# Patient Record
Sex: Female | Born: 1960 | Race: White | Hispanic: No | Smoking: Former smoker
Health system: Southern US, Community
[De-identification: ages and names within clinical notes are randomized; demographics above are authoritative.]

## PROBLEM LIST (undated history)

## (undated) ENCOUNTER — Emergency Department: Discharge status: Absent without leave | Payer: BLUE CROSS/BLUE SHIELD

## (undated) DIAGNOSIS — I10 Essential (primary) hypertension: Secondary | ICD-10-CM

## (undated) DIAGNOSIS — F329 Major depressive disorder, single episode, unspecified: Secondary | ICD-10-CM

## (undated) DIAGNOSIS — F32A Depression, unspecified: Secondary | ICD-10-CM

## (undated) HISTORY — PX: GANGLION CYST EXCISION: SHX1691

## (undated) HISTORY — PX: FRACTURE SURGERY: SHX138

## (undated) HISTORY — PX: ABDOMINAL HYSTERECTOMY: SHX81

---

## 2009-07-23 ENCOUNTER — Ambulatory Visit: Payer: Self-pay | Admitting: Orthopedic Surgery

## 2009-07-30 ENCOUNTER — Ambulatory Visit: Payer: Self-pay | Admitting: Orthopedic Surgery

## 2009-08-19 ENCOUNTER — Ambulatory Visit: Payer: Self-pay | Admitting: Family Medicine

## 2009-08-28 ENCOUNTER — Ambulatory Visit: Payer: Self-pay | Admitting: Family Medicine

## 2012-04-11 ENCOUNTER — Ambulatory Visit: Payer: Self-pay | Admitting: Family Medicine

## 2012-06-09 ENCOUNTER — Ambulatory Visit: Payer: Self-pay | Admitting: Unknown Physician Specialty

## 2012-06-12 LAB — PATHOLOGY REPORT

## 2013-11-01 ENCOUNTER — Other Ambulatory Visit: Payer: Self-pay | Admitting: Orthopedic Surgery

## 2013-11-01 LAB — SYNOVIAL CELL COUNT + DIFF, W/ CRYSTALS
Basophil: 0 %
Eosinophil: 0 %
LYMPHS PCT: 30 %
Neutrophils: 46 %
Nucleated Cell Count: 194 /mm3
Other Cells BF: 0 %
Other Mononuclear Cells: 24 %

## 2013-11-05 LAB — BODY FLUID CULTURE

## 2015-12-17 ENCOUNTER — Other Ambulatory Visit: Payer: Self-pay | Admitting: Orthopedic Surgery

## 2016-01-01 ENCOUNTER — Other Ambulatory Visit: Payer: Self-pay | Admitting: Orthopedic Surgery

## 2016-01-14 ENCOUNTER — Encounter
Admission: RE | Admit: 2016-01-14 | Discharge: 2016-01-14 | Disposition: A | Discharge status: Home / Self Care | Payer: BLUE CROSS/BLUE SHIELD | Source: Ambulatory Visit | Attending: Orthopedic Surgery | Admitting: Orthopedic Surgery

## 2016-01-14 ENCOUNTER — Ambulatory Visit
Admission: RE | Admit: 2016-01-14 | Discharge: 2016-01-14 | Disposition: A | Discharge status: Home / Self Care | Payer: BLUE CROSS/BLUE SHIELD | Source: Ambulatory Visit | Attending: Orthopedic Surgery | Admitting: Orthopedic Surgery

## 2016-01-14 DIAGNOSIS — Z01811 Encounter for preprocedural respiratory examination: Secondary | ICD-10-CM | POA: Insufficient documentation

## 2016-01-14 DIAGNOSIS — Z01818 Encounter for other preprocedural examination: Secondary | ICD-10-CM | POA: Insufficient documentation

## 2016-01-14 HISTORY — DX: Depression, unspecified: F32.A

## 2016-01-14 HISTORY — DX: Major depressive disorder, single episode, unspecified: F32.9

## 2016-01-14 HISTORY — DX: Essential (primary) hypertension: I10

## 2016-01-14 LAB — CBC WITH DIFFERENTIAL/PLATELET
BASOS ABS: 0.1 10*3/uL (ref 0–0.1)
BASOS PCT: 1 %
EOS ABS: 0.3 10*3/uL (ref 0–0.7)
EOS PCT: 3 %
HCT: 39.6 % (ref 35.0–47.0)
Hemoglobin: 13.7 g/dL (ref 12.0–16.0)
Lymphocytes Relative: 26 %
Lymphs Abs: 2.3 10*3/uL (ref 1.0–3.6)
MCH: 30.6 pg (ref 26.0–34.0)
MCHC: 34.6 g/dL (ref 32.0–36.0)
MCV: 88.5 fL (ref 80.0–100.0)
MONO ABS: 0.8 10*3/uL (ref 0.2–0.9)
Monocytes Relative: 9 %
Neutro Abs: 5.4 10*3/uL (ref 1.4–6.5)
Neutrophils Relative %: 61 %
PLATELETS: 216 10*3/uL (ref 150–440)
RBC: 4.47 MIL/uL (ref 3.80–5.20)
RDW: 13.6 % (ref 11.5–14.5)
WBC: 8.9 10*3/uL (ref 3.6–11.0)

## 2016-01-14 LAB — TYPE AND SCREEN
ABO/RH(D): O POS
Antibody Screen: NEGATIVE

## 2016-01-14 LAB — URINALYSIS COMPLETE WITH MICROSCOPIC (ARMC ONLY)
Bacteria, UA: NONE SEEN
Bilirubin Urine: NEGATIVE
Glucose, UA: NEGATIVE mg/dL
KETONES UR: NEGATIVE mg/dL
LEUKOCYTES UA: NEGATIVE
Nitrite: NEGATIVE
PH: 5 (ref 5.0–8.0)
PROTEIN: NEGATIVE mg/dL
Specific Gravity, Urine: 1.01 (ref 1.005–1.030)
WBC UA: NONE SEEN WBC/hpf (ref 0–5)

## 2016-01-14 LAB — HEMOGLOBIN A1C: HEMOGLOBIN A1C: 6.3 % — AB (ref 4.0–6.0)

## 2016-01-14 LAB — BASIC METABOLIC PANEL
Anion gap: 5 (ref 5–15)
BUN: 18 mg/dL (ref 6–20)
CALCIUM: 9.2 mg/dL (ref 8.9–10.3)
CO2: 30 mmol/L (ref 22–32)
Chloride: 102 mmol/L (ref 101–111)
Creatinine, Ser: 0.86 mg/dL (ref 0.44–1.00)
GFR calc Af Amer: >60 mL/min (ref >60)
GLUCOSE: 121 mg/dL — AB (ref 65–99)
Potassium: 3.9 mmol/L (ref 3.5–5.1)
SODIUM: 137 mmol/L (ref 135–145)

## 2016-01-14 LAB — SURGICAL PCR SCREEN
MRSA, PCR: NEGATIVE
Staphylococcus aureus: NEGATIVE

## 2016-01-14 LAB — PROTIME-INR
INR: 0.81
PROTHROMBIN TIME: 11.2 s — AB (ref 11.4–15.2)

## 2016-01-14 LAB — APTT: APTT: 27 s (ref 24–36)

## 2016-01-14 NOTE — Patient Instructions (Signed)
Your procedure is scheduled on: Thursday 01/22/16 Report to Day Surgery. 2ND FLOOR MEDICAL MALL ENTRANCE To find out your arrival time please call [phone] between 1PM - 3PM on Wednesday 01/21/16.  Remember: Instructions that are not followed completely may result in serious medical risk, up to and including death, or upon the discretion of your surgeon and anesthesiologist your surgery may need to be rescheduled.    __X__ 1. Do not eat food or drink liquids after midnight. No gum chewing or hard candies.     __X__ 2. No Alcohol for 24 hours before or after surgery.   ____ 3. Bring all medications with you on the day of surgery if instructed.    __X__ 4. Notify your doctor if there is any change in your medical condition     (cold, fever, infections).     Do not wear jewelry, make-up, hairpins, clips or nail polish.  Do not wear lotions, powders, or perfumes.   Do not shave 48 hours prior to surgery. Men may shave face and neck.  Do not bring valuables to the hospital.    [facility] is not responsible for any belongings or valuables.               Contacts, dentures or bridgework may not be worn into surgery.  Leave your suitcase in the car. After surgery it may be brought to your room.  For patients admitted to the hospital, discharge time is determined by your                treatment team.   Patients discharged the day of surgery will not be allowed to drive home.   Please read over the following fact sheets that you were given:   MRSA Information and Surgical Site Infection Prevention   __X__ Take these medicines the morning of surgery with A SIP OF WATER:    1. AMLODIPINE  2. ARIPIPRAZOLE  3. BUPROPION  4. FEXOFENADINE  5. TELMISARTAN  6.  ____ Fleet Enema (as directed)   __X__ Use CHG Soap as directed  ____ Use inhalers on the day of surgery  ____ Stop metformin 2 days prior to surgery    ____ Take 1/2 of usual insulin dose the night before surgery and none on  the morning of surgery.   __X__ Stop Coumadin/Plavix/aspirin on 7 DAYS PRIOR TO SURGERY  ____ Stop Anti-inflammatories on    __X__ Stop supplements until after surgery.   FISH OIL, CINNAMON TODAY  ____ Bring C-Pap to the hospital.

## 2016-01-16 NOTE — Pre-Procedure Instructions (Addendum)
11/18/2015 [facility] Component Name Value Ref Range  Vent Rate (bpm) 69   PR Interval (msec) 152   QRS Interval (msec) 88   QT Interval (msec) 424   QTc (msec) 454   Result Narrative  Normal sinus rhythm Left ventricular hypertrophy with repolarization abnormality Baseline artifact  Abnormal ECG No previous ECGs available I reviewed and concur with this report. Electronically signed ZO:[id number]:FREEDMAN, MD, NEIL ([id number]) on 11/18/2015 10:40:41 PM  Status Results Details      EKG 01/14/16 OK BY DR Shela CommonsJ ADAMS

## 2016-01-22 ENCOUNTER — Inpatient Hospital Stay: Payer: BLUE CROSS/BLUE SHIELD | Admitting: Anesthesiology

## 2016-01-22 ENCOUNTER — Inpatient Hospital Stay: Payer: BLUE CROSS/BLUE SHIELD

## 2016-01-22 ENCOUNTER — Encounter: Payer: Self-pay | Admitting: *Deleted

## 2016-01-22 ENCOUNTER — Encounter
Admission: RE | Disposition: A | Discharge status: Home / Self Care | Payer: Self-pay | Source: Ambulatory Visit | Attending: Orthopedic Surgery

## 2016-01-22 ENCOUNTER — Inpatient Hospital Stay
Admission: RE | Admit: 2016-01-22 | Discharge: 2016-01-24 | DRG: 470 | Disposition: A | Discharge status: Home / Self Care | Payer: BLUE CROSS/BLUE SHIELD | Source: Ambulatory Visit | Attending: Orthopedic Surgery | Admitting: Orthopedic Surgery

## 2016-01-22 DIAGNOSIS — Z79899 Other long term (current) drug therapy: Secondary | ICD-10-CM | POA: Diagnosis not present

## 2016-01-22 DIAGNOSIS — I10 Essential (primary) hypertension: Secondary | ICD-10-CM | POA: Diagnosis present

## 2016-01-22 DIAGNOSIS — Z96659 Presence of unspecified artificial knee joint: Secondary | ICD-10-CM

## 2016-01-22 DIAGNOSIS — M1712 Unilateral primary osteoarthritis, left knee: Principal | ICD-10-CM | POA: Diagnosis present

## 2016-01-22 DIAGNOSIS — F329 Major depressive disorder, single episode, unspecified: Secondary | ICD-10-CM | POA: Diagnosis present

## 2016-01-22 DIAGNOSIS — Z87891 Personal history of nicotine dependence: Secondary | ICD-10-CM

## 2016-01-22 HISTORY — PX: TOTAL KNEE ARTHROPLASTY: SHX125

## 2016-01-22 SURGERY — ARTHROPLASTY, KNEE, TOTAL
Anesthesia: Monitor Anesthesia Care | Site: Knee | Laterality: Left | Wound class: Clean

## 2016-01-22 MED ORDER — MEPERIDINE HCL 25 MG/ML IJ SOLN: 6.2500 mg | INTRAMUSCULAR | Status: DC | PRN

## 2016-01-22 MED ORDER — CINNAMON 500 MG PO CAPS: 1000.0000 mg | ORAL_CAPSULE | Freq: Every day | ORAL | Status: DC

## 2016-01-22 MED ORDER — CLINDAMYCIN PHOSPHATE 600 MG/50ML IV SOLN
600.0000 mg | Freq: Once | INTRAVENOUS | Status: AC
  Administered 2016-01-22: 600 mg via INTRAVENOUS

## 2016-01-22 MED ORDER — METHOCARBAMOL 1000 MG/10ML IJ SOLN
500.0000 mg | Freq: Four times a day (QID) | INTRAVENOUS | Status: DC | PRN
  Filled 2016-01-22: qty 5

## 2016-01-22 MED ORDER — FENTANYL CITRATE (PF) 100 MCG/2ML IJ SOLN
25.0000 ug | INTRAMUSCULAR | Status: DC | PRN
  Administered 2016-01-22 (×2): 50 ug via INTRAVENOUS

## 2016-01-22 MED ORDER — CELECOXIB 200 MG PO CAPS
200.0000 mg | ORAL_CAPSULE | Freq: Two times a day (BID) | ORAL | Status: DC
  Administered 2016-01-22 – 2016-01-24 (×5): 200 mg via ORAL
  Filled 2016-01-22 (×5): qty 1

## 2016-01-22 MED ORDER — DOCUSATE SODIUM 100 MG PO CAPS
100.0000 mg | ORAL_CAPSULE | Freq: Two times a day (BID) | ORAL | Status: DC
  Administered 2016-01-22 – 2016-01-24 (×5): 100 mg via ORAL
  Filled 2016-01-22 (×5): qty 1

## 2016-01-22 MED ORDER — MENTHOL 3 MG MT LOZG
1.0000 | LOZENGE | OROMUCOSAL | Status: DC | PRN
  Filled 2016-01-22: qty 9

## 2016-01-22 MED ORDER — GABAPENTIN 300 MG PO CAPS
300.0000 mg | ORAL_CAPSULE | Freq: Three times a day (TID) | ORAL | Status: DC
  Administered 2016-01-22 – 2016-01-24 (×6): 300 mg via ORAL
  Filled 2016-01-22 (×6): qty 1

## 2016-01-22 MED ORDER — METHOCARBAMOL 500 MG PO TABS: 500.0000 mg | ORAL_TABLET | Freq: Four times a day (QID) | ORAL | Status: DC | PRN

## 2016-01-22 MED ORDER — ENOXAPARIN SODIUM 30 MG/0.3ML ~~LOC~~ SOLN
30.0000 mg | Freq: Two times a day (BID) | SUBCUTANEOUS | Status: DC
  Administered 2016-01-23 – 2016-01-24 (×3): 30 mg via SUBCUTANEOUS
  Filled 2016-01-22 (×3): qty 0.3

## 2016-01-22 MED ORDER — PROPOFOL 500 MG/50ML IV EMUL
INTRAVENOUS | Status: DC | PRN
  Administered 2016-01-22: 100 ug/kg/min via INTRAVENOUS

## 2016-01-22 MED ORDER — ACETAMINOPHEN 325 MG PO TABS: 650.0000 mg | ORAL_TABLET | Freq: Four times a day (QID) | ORAL | Status: DC | PRN

## 2016-01-22 MED ORDER — DIPHENHYDRAMINE HCL 12.5 MG/5ML PO ELIX: 12.5000 mg | ORAL_SOLUTION | ORAL | Status: DC | PRN

## 2016-01-22 MED ORDER — ONDANSETRON HCL 4 MG/2ML IJ SOLN: 4.0000 mg | Freq: Four times a day (QID) | INTRAMUSCULAR | Status: DC | PRN

## 2016-01-22 MED ORDER — PROPOFOL 10 MG/ML IV BOLUS
INTRAVENOUS | Status: DC | PRN
  Administered 2016-01-22: 50 mg via INTRAVENOUS

## 2016-01-22 MED ORDER — HYDROCHLOROTHIAZIDE 25 MG PO TABS
25.0000 mg | ORAL_TABLET | Freq: Every day | ORAL | Status: DC
  Administered 2016-01-23 – 2016-01-24 (×2): 25 mg via ORAL
  Filled 2016-01-22: qty 1

## 2016-01-22 MED ORDER — ASPIRIN EC 81 MG PO TBEC
81.0000 mg | DELAYED_RELEASE_TABLET | Freq: Every day | ORAL | Status: DC
  Administered 2016-01-22 – 2016-01-23 (×2): 81 mg via ORAL
  Filled 2016-01-22 (×2): qty 1

## 2016-01-22 MED ORDER — OXYCODONE HCL 5 MG PO TABS
10.0000 mg | ORAL_TABLET | ORAL | Status: DC | PRN
  Administered 2016-01-22 – 2016-01-24 (×5): 10 mg via ORAL
  Filled 2016-01-22 (×5): qty 2

## 2016-01-22 MED ORDER — AMLODIPINE BESYLATE 10 MG PO TABS
10.0000 mg | ORAL_TABLET | Freq: Every day | ORAL | Status: DC
  Filled 2016-01-22: qty 1

## 2016-01-22 MED ORDER — ONDANSETRON HCL 4 MG PO TABS: 4.0000 mg | ORAL_TABLET | Freq: Four times a day (QID) | ORAL | Status: DC | PRN

## 2016-01-22 MED ORDER — LACTATED RINGERS IV SOLN
INTRAVENOUS | Status: DC
  Administered 2016-01-22 (×2): via INTRAVENOUS

## 2016-01-22 MED ORDER — NEOMYCIN-POLYMYXIN B GU 40-200000 IR SOLN
Status: AC
  Filled 2016-01-22: qty 20

## 2016-01-22 MED ORDER — IRBESARTAN 150 MG PO TABS
300.0000 mg | ORAL_TABLET | Freq: Every day | ORAL | Status: DC
  Administered 2016-01-23 – 2016-01-24 (×2): 300 mg via ORAL
  Filled 2016-01-22: qty 2

## 2016-01-22 MED ORDER — METOCLOPRAMIDE HCL 5 MG/ML IJ SOLN: 5.0000 mg | Freq: Three times a day (TID) | INTRAMUSCULAR | Status: DC | PRN

## 2016-01-22 MED ORDER — OXYCODONE HCL 5 MG/5ML PO SOLN: 5.0000 mg | Freq: Once | ORAL | Status: DC | PRN

## 2016-01-22 MED ORDER — BUPIVACAINE HCL (PF) 0.5 % IJ SOLN
INTRAMUSCULAR | Status: DC | PRN
  Administered 2016-01-22: 3 mL

## 2016-01-22 MED ORDER — SODIUM CHLORIDE 0.9 % IV SOLN
INTRAVENOUS | Status: DC
  Administered 2016-01-22: 12:00:00 via INTRAVENOUS
  Administered 2016-01-22: 1000 mL via INTRAVENOUS
  Administered 2016-01-23: 04:00:00 via INTRAVENOUS

## 2016-01-22 MED ORDER — BUPROPION HCL ER (XL) 150 MG PO TB24
300.0000 mg | ORAL_TABLET | Freq: Every day | ORAL | Status: DC
  Administered 2016-01-23 – 2016-01-24 (×2): 300 mg via ORAL
  Filled 2016-01-22 (×2): qty 2

## 2016-01-22 MED ORDER — MORPHINE SULFATE (PF) 4 MG/ML IV SOLN
INTRAVENOUS | Status: AC
  Filled 2016-01-22: qty 1

## 2016-01-22 MED ORDER — FAMOTIDINE 20 MG PO TABS
20.0000 mg | ORAL_TABLET | Freq: Once | ORAL | Status: AC
  Administered 2016-01-22: 20 mg via ORAL

## 2016-01-22 MED ORDER — FENTANYL CITRATE (PF) 100 MCG/2ML IJ SOLN
INTRAMUSCULAR | Status: AC
  Filled 2016-01-22: qty 2

## 2016-01-22 MED ORDER — SODIUM CHLORIDE 0.9 % IJ SOLN
INTRAMUSCULAR | Status: AC
  Filled 2016-01-22: qty 50

## 2016-01-22 MED ORDER — SODIUM CHLORIDE FLUSH 0.9 % IV SOLN
INTRAVENOUS | Status: AC
  Administered 2016-01-22: 10 mL
  Filled 2016-01-22: qty 3

## 2016-01-22 MED ORDER — METOCLOPRAMIDE HCL 10 MG PO TABS: 5.0000 mg | ORAL_TABLET | Freq: Three times a day (TID) | ORAL | Status: DC | PRN

## 2016-01-22 MED ORDER — CLINDAMYCIN PHOSPHATE 600 MG/50ML IV SOLN
600.0000 mg | Freq: Four times a day (QID) | INTRAVENOUS | Status: AC
  Administered 2016-01-22 (×2): 600 mg via INTRAVENOUS
  Filled 2016-01-22 (×2): qty 50

## 2016-01-22 MED ORDER — MAGNESIUM HYDROXIDE 400 MG/5ML PO SUSP
30.0000 mL | Freq: Every day | ORAL | Status: DC | PRN
  Administered 2016-01-23: 30 mL via ORAL
  Filled 2016-01-22: qty 30

## 2016-01-22 MED ORDER — CHLORHEXIDINE GLUCONATE 4 % EX LIQD: 60.0000 mL | Freq: Once | CUTANEOUS | Status: DC

## 2016-01-22 MED ORDER — ALUM & MAG HYDROXIDE-SIMETH 200-200-20 MG/5ML PO SUSP: 30.0000 mL | ORAL | Status: DC | PRN

## 2016-01-22 MED ORDER — ZOLPIDEM TARTRATE 5 MG PO TABS: 5.0000 mg | ORAL_TABLET | Freq: Every evening | ORAL | Status: DC | PRN

## 2016-01-22 MED ORDER — PHENOL 1.4 % MT LIQD
1.0000 | OROMUCOSAL | Status: DC | PRN
  Filled 2016-01-22: qty 177

## 2016-01-22 MED ORDER — BISACODYL 5 MG PO TBEC
5.0000 mg | DELAYED_RELEASE_TABLET | Freq: Every day | ORAL | Status: DC | PRN
  Administered 2016-01-23: 5 mg via ORAL
  Filled 2016-01-22: qty 1

## 2016-01-22 MED ORDER — CLINDAMYCIN PHOSPHATE 600 MG/50ML IV SOLN
INTRAVENOUS | Status: AC
  Filled 2016-01-22: qty 50

## 2016-01-22 MED ORDER — ARIPIPRAZOLE 2 MG PO TABS
2.0000 mg | ORAL_TABLET | Freq: Every day | ORAL | Status: DC
  Administered 2016-01-22 – 2016-01-23 (×2): 2 mg via ORAL
  Filled 2016-01-22 (×2): qty 1

## 2016-01-22 MED ORDER — HYDROMORPHONE HCL 1 MG/ML IJ SOLN: 1.0000 mg | INTRAMUSCULAR | Status: DC | PRN

## 2016-01-22 MED ORDER — FAMOTIDINE 20 MG PO TABS
ORAL_TABLET | ORAL | Status: AC
  Filled 2016-01-22: qty 1

## 2016-01-22 MED ORDER — BUPIVACAINE-EPINEPHRINE (PF) 0.25% -1:200000 IJ SOLN
INTRAMUSCULAR | Status: AC
  Filled 2016-01-22: qty 30

## 2016-01-22 MED ORDER — NEOMYCIN-POLYMYXIN B GU 40-200000 IR SOLN
Status: DC | PRN
  Administered 2016-01-22: 16 mL

## 2016-01-22 MED ORDER — MAGNESIUM CITRATE PO SOLN
1.0000 | Freq: Once | ORAL | Status: DC | PRN
  Filled 2016-01-22: qty 296

## 2016-01-22 MED ORDER — BUPIVACAINE LIPOSOME 1.3 % IJ SUSP
INTRAMUSCULAR | Status: AC
  Filled 2016-01-22: qty 20

## 2016-01-22 MED ORDER — OXYCODONE HCL 5 MG PO TABS: 5.0000 mg | ORAL_TABLET | Freq: Once | ORAL | Status: DC | PRN

## 2016-01-22 MED ORDER — ACETAMINOPHEN 650 MG RE SUPP: 650.0000 mg | Freq: Four times a day (QID) | RECTAL | Status: DC | PRN

## 2016-01-22 MED ORDER — EPHEDRINE SULFATE 50 MG/ML IJ SOLN
INTRAMUSCULAR | Status: DC | PRN
  Administered 2016-01-22: 15 mg via INTRAVENOUS
  Administered 2016-01-22: 10 mg via INTRAVENOUS

## 2016-01-22 MED ORDER — PROMETHAZINE HCL 25 MG/ML IJ SOLN: 6.2500 mg | INTRAMUSCULAR | Status: DC | PRN

## 2016-01-22 MED ORDER — LORATADINE 10 MG PO TABS
10.0000 mg | ORAL_TABLET | Freq: Every day | ORAL | Status: DC
  Administered 2016-01-23 – 2016-01-24 (×2): 10 mg via ORAL
  Filled 2016-01-22: qty 1

## 2016-01-22 MED ORDER — PHENYLEPHRINE HCL 10 MG/ML IJ SOLN
INTRAMUSCULAR | Status: DC | PRN
  Administered 2016-01-22: 100 ug via INTRAVENOUS

## 2016-01-22 MED ORDER — MORPHINE SULFATE 4 MG/ML IJ SOLN
INTRAMUSCULAR | Status: DC | PRN
  Administered 2016-01-22: 32 mL via INTRA_ARTICULAR

## 2016-01-22 MED ORDER — MIDAZOLAM HCL 5 MG/5ML IJ SOLN
INTRAMUSCULAR | Status: DC | PRN
  Administered 2016-01-22 (×2): 1 mg via INTRAVENOUS

## 2016-01-22 MED ORDER — ATORVASTATIN CALCIUM 20 MG PO TABS
20.0000 mg | ORAL_TABLET | Freq: Every day | ORAL | Status: DC
  Administered 2016-01-22 – 2016-01-23 (×2): 20 mg via ORAL
  Filled 2016-01-22 (×2): qty 1

## 2016-01-22 MED ORDER — SODIUM CHLORIDE 0.9 % IV SOLN
INTRAVENOUS | Status: DC | PRN
  Administered 2016-01-22: 120 mL

## 2016-01-22 MED ORDER — SODIUM CHLORIDE 0.9 % IV SOLN
INTRAVENOUS | Status: DC | PRN
  Administered 2016-01-22: 10 ug/min via INTRAVENOUS

## 2016-01-22 SURGICAL SUPPLY — 70 items
AUTOTRANSFUS HAS 1/8 (MISCELLANEOUS) ×3
BAG DECANTER FOR FLEXI CONT (MISCELLANEOUS) IMPLANT
BLADE DEBAKEY 8.0 (BLADE) ×2 IMPLANT
BLADE DEBAKEY 8.0MM (BLADE) ×1
BLADE SAW 1 (BLADE) ×3 IMPLANT
BLADE SAW 1/2 (BLADE) ×3 IMPLANT
BLADE SURG 15 STRL LF DISP TIS (BLADE) ×1 IMPLANT
BLADE SURG 15 STRL SS (BLADE) ×2
BOWL CEMENT MIX W/ADAPTER (MISCELLANEOUS) ×3 IMPLANT
CANISTER SUCT 1200ML W/VALVE (MISCELLANEOUS) ×6 IMPLANT
CAP KNEE TOTAL 3 SIGMA ×3 IMPLANT
CATH TRAY METER 16FR LF (MISCELLANEOUS) ×3 IMPLANT
CEMENT HV SMART SET (Cement) ×9 IMPLANT
CNTNR SPEC 2.5X3XGRAD LEK (MISCELLANEOUS) ×1
CONT SPEC 4OZ STER OR WHT (MISCELLANEOUS) ×2
CONTAINER SPEC 2.5X3XGRAD LEK (MISCELLANEOUS) ×1 IMPLANT
COOLER POLAR GLACIER W/PUMP (MISCELLANEOUS) ×3 IMPLANT
CUFF TOURN 24 STER (MISCELLANEOUS) IMPLANT
CUFF TOURN 30 STER DUAL PORT (MISCELLANEOUS) ×3 IMPLANT
DRAPE IMP U-DRAPE 54X76 (DRAPES) ×3 IMPLANT
DRAPE INCISE IOBAN 66X60 STRL (DRAPES) ×3 IMPLANT
DRAPE SHEET LG 3/4 BI-LAMINATE (DRAPES) ×3 IMPLANT
DRAPE SURG 17X11 SM STRL (DRAPES) ×6 IMPLANT
DRSG OPSITE POSTOP 4X12 (GAUZE/BANDAGES/DRESSINGS) ×3 IMPLANT
DRSG OPSITE POSTOP 4X14 (GAUZE/BANDAGES/DRESSINGS) IMPLANT
DURAPREP 26ML APPLICATOR (WOUND CARE) ×9 IMPLANT
ELECT REM PT RETURN 9FT ADLT (ELECTROSURGICAL) ×3
ELECTRODE REM PT RTRN 9FT ADLT (ELECTROSURGICAL) ×1 IMPLANT
GAUZE PETRO XEROFOAM 1X8 (MISCELLANEOUS) ×3 IMPLANT
GAUZE SPONGE 4X4 12PLY STRL (GAUZE/BANDAGES/DRESSINGS) IMPLANT
GLOVE BIOGEL PI IND STRL 9 (GLOVE) ×2 IMPLANT
GLOVE BIOGEL PI INDICATOR 9 (GLOVE) ×4
GLOVE SURG 9.0 ORTHO LTXF (GLOVE) ×9 IMPLANT
GOWN STRL REUS TWL 2XL XL LVL4 (GOWN DISPOSABLE) ×3 IMPLANT
GOWN STRL REUS W/ TWL LRG LVL3 (GOWN DISPOSABLE) ×2 IMPLANT
GOWN STRL REUS W/TWL LRG LVL3 (GOWN DISPOSABLE) ×4
GOWN STRL REUS W/TWL XL LVL4 (GOWN DISPOSABLE) ×3 IMPLANT
HANDPIECE INTERPULSE COAX TIP (DISPOSABLE) ×2
IMMBOLIZER KNEE 19 BLUE UNIV (SOFTGOODS) ×3 IMPLANT
IV NS 100ML SINGLE PACK (IV SOLUTION) IMPLANT
KIT RM TURNOVER STRD PROC AR (KITS) ×3 IMPLANT
NDL SAFETY 18GX1.5 (NEEDLE) ×3 IMPLANT
NDL SAFETY 22GX1.5 (NEEDLE) ×3 IMPLANT
NEEDLE FILTER BLUNT 18X 1/2SAF (NEEDLE) ×2
NEEDLE FILTER BLUNT 18X1 1/2 (NEEDLE) ×1 IMPLANT
NEEDLE SPNL 20GX3.5 QUINCKE YW (NEEDLE) ×3 IMPLANT
NS IRRIG 1000ML POUR BTL (IV SOLUTION) ×3 IMPLANT
PACK TOTAL KNEE (MISCELLANEOUS) ×3 IMPLANT
PAD PREP 24X41 OB/GYN DISP (PERSONAL CARE ITEMS) IMPLANT
PAD WRAPON POLAR KNEE (MISCELLANEOUS) ×1 IMPLANT
SET HNDPC FAN SPRY TIP SCT (DISPOSABLE) ×1 IMPLANT
SOL .9 NS 3000ML IRR  AL (IV SOLUTION) ×2
SOL .9 NS 3000ML IRR UROMATIC (IV SOLUTION) ×1 IMPLANT
SPONGE DRAIN TRACH 4X4 STRL 2S (GAUZE/BANDAGES/DRESSINGS) ×3 IMPLANT
SPONGE LAP 18X18 5 PK (GAUZE/BANDAGES/DRESSINGS) IMPLANT
STAPLER SKIN PROX 35W (STAPLE) ×3 IMPLANT
SUCTION FRAZIER HANDLE 10FR (MISCELLANEOUS) ×2
SUCTION TUBE FRAZIER 10FR DISP (MISCELLANEOUS) ×1 IMPLANT
SUT ETHIBOND NAB CT1 #1 30IN (SUTURE) ×6 IMPLANT
SUT MNCRL AB 3-0 PS2 27 (SUTURE) IMPLANT
SUT VIC AB 0 CT1 36 (SUTURE) ×3 IMPLANT
SUT VIC AB 2-0 CT1 (SUTURE) ×6 IMPLANT
SYR 20CC LL (SYRINGE) ×3 IMPLANT
SYR 50ML LL SCALE MARK (SYRINGE) ×6 IMPLANT
SYSTEM AUTOTRANSFUS DUAL TROCR (MISCELLANEOUS) ×1 IMPLANT
TOWER CARTRIDGE SMART MIX (DISPOSABLE) ×3 IMPLANT
TUBE SUCT KAM VAC (TUBING) ×3 IMPLANT
WIRE Z .062 C-WIRE SPADE TIP (WIRE) ×3 IMPLANT
WRAPON POLAR PAD KNEE (MISCELLANEOUS) ×3
c-wire .062 spade (1.57mm) IMPLANT

## 2016-01-22 NOTE — Evaluation (Signed)
Physical Therapy Evaluation Patient Details Name: Denise MechanicDeborah C Mcintyre MRN: [id number] DOB: 17-Sep-1960 Today's Date: 01/22/2016   History of Present Illness  Denise DoveDebbie Mcintyre is a 55yo white female who comes [facility] for elective L TKA after a chronic history of DJD and decline in function.   Clinical Impression  Pt presenting on POD0 for PT evaluation. Pt reports return of sensation to operative limb. Nasal canual doffed upon entry with SaO2 WNL. Pt completes all therex with minA and heavy VC for instruction. All functional mobility is performed with supervision-minA, which is decreased from her baseline level of function. Minimal ambulation is performed at bedside and tolerated well, then patient is helped to transition to chair. All mobility performed with Left KI donned. The patient is at high risk for falls as evidence by gait speed <1.4321m/s, forward reach <5", and multiple LOB demonstrated throughout session.   Patient presenting with impairment of strength, range of motion, balance, and activity tolerance, limiting ability to perform ADL and mobility tasks at  baseline level of function. Patient will benefit from skilled intervention to address the above impairments and limitations, in order to restore to prior level of function, improve patient safety upon discharge, and to decrease falls risk.       Follow Up Recommendations Home health PT    Equipment Recommendations  None recommended by PT    Recommendations for Other Services       Precautions / Restrictions Precautions Precautions: Knee Precaution Booklet Issued: No Required Braces or Orthoses: Knee Immobilizer - Left Knee Immobilizer - Left: On when out of bed or walking Restrictions Weight Bearing Restrictions: Yes LLE Weight Bearing: Weight bearing as tolerated      Mobility  Bed Mobility Overal bed mobility: Needs Assistance Bed Mobility: Supine to Sit     Supine to sit: Supervision        Transfers Overall transfer  level: Needs assistance Equipment used: Rolling walker (2 wheeled) Transfers: Sit to/from Stand Sit to Stand: Min guard;From elevated surface            Ambulation/Gait Ambulation/Gait assistance: Min guard Ambulation Distance (Feet): 12 Feet (VC for TKE and short steps; alternates AMB/retroAMB) Assistive device: Rolling walker (2 wheeled)     Gait velocity interpretation: <1.8 ft/sec, indicative of risk for recurrent falls    Stairs            Wheelchair Mobility    Modified Rankin (Stroke Patients Only)       Balance Overall balance assessment: Needs assistance;No apparent balance deficits (not formally assessed)                                           Pertinent Vitals/Pain Pain Assessment: 0-10 Pain Score: 6  Pain Location: Left knee, anterior Pain Descriptors / Indicators: Operative site guarding Pain Intervention(s): Limited activity within patient's tolerance;Monitored during session;Premedicated before session;Ice applied    Home Living Family/patient expects to be discharged to:: Private residence Living Arrangements: Alone Available Help at Discharge: Family;Available PRN/intermittently Type of Home: House Home Access: Stairs to enter Entrance Stairs-Rails: None Entrance Stairs-Number of Steps: 2 Home Layout: One level Home Equipment: Walker - 2 wheels;Cane - single point;Bedside commode      Prior Function Level of Independence: Independent with assistive device(s) (Household Distance AMB with AD)               Hand  Dominance   Dominant Hand: Right    Extremity/Trunk Assessment   Upper Extremity Assessment: Overall WFL for tasks assessed           Lower Extremity Assessment: Overall WFL for tasks assessed;LLE deficits/detail         Communication   Communication: No difficulties  Cognition Arousal/Alertness: Awake/alert Behavior During Therapy: WFL for tasks assessed/performed Overall Cognitive  Status: Within Functional Limits for tasks assessed                      General Comments      Exercises Total Joint Exercises Ankle Circles/Pumps: AROM;Both;15 reps;Supine Quad Sets: Left;10 reps;Supine Heel Slides: Left;10 reps;Supine Hip ABduction/ADduction: Left;10 reps;Supine Straight Leg Raises: Left;10 reps;Supine Goniometric ROM: 14-62 degrees Left knee flexion      Assessment/Plan    PT Assessment Patient needs continued PT services  PT Diagnosis Difficulty walking;Abnormality of gait;Acute pain   PT Problem List Decreased strength;Decreased activity tolerance;Decreased range of motion;Decreased balance;Decreased mobility;Decreased knowledge of use of DME;Decreased knowledge of precautions;Pain  PT Treatment Interventions DME instruction;Gait training;Stair training;Functional mobility training;Therapeutic activities;Therapeutic exercise;Balance training;Patient/family education   PT Goals (Current goals can be found in the Care Plan section) Acute Rehab PT Goals Patient Stated Goal: Return to home and get back to work.  PT Goal Formulation: With patient Time For Goal Achievement: 02/05/16 Potential to Achieve Goals: Good    Frequency BID   Barriers to discharge        Co-evaluation               End of Session Equipment Utilized During Treatment: Gait belt;Left knee immobilizer Activity Tolerance: Patient tolerated treatment well;Patient limited by pain;Patient limited by fatigue;Treatment limited secondary to medical complications (Comment) (lightheadedness) Patient left: in chair;with call bell/phone within reach;with SCD's reapplied;with nursing/sitter in room Nurse Communication: Mobility status;Other (comment) (SaO2 WNL; no chair alarm in room. )         Time: [id number] PT Time Calculation (min) (ACUTE ONLY): 34 min   Charges:   PT Evaluation $PT Eval Low Complexity: 1 Procedure PT Treatments $Therapeutic Exercise: 8-22 mins   PT G  Codes:       9:27 PM, 01/22/16 Denise LintsAllan C Buccola, PT, DPT Physical Therapist - [place] [phone](ASCOM)  [phone] (mobile)   .

## 2016-01-22 NOTE — H&P (Signed)
The patient has been re-examined, and the chart reviewed, and there have been no interval changes to the documented history and physical.    The risks, benefits, and alternatives have been discussed at length, and the patient is willing to proceed.   

## 2016-01-22 NOTE — Op Note (Signed)
DATE OF SURGERY:  01/22/2016 TIME: 11:03 AM  PATIENT NAME:  Denise Mcintyre   AGE: 55 y.o.    PRE-OPERATIVE DIAGNOSIS:  Left knee osteoarthritis   POST-OPERATIVE DIAGNOSIS:  Same  PROCEDURE:  Procedure(s): TOTAL KNEE ARTHROPLASTY  SURGEON:  Juanell Fairly, MD   ASSISTANT:  Surgical tech  OPERATIVE IMPLANTS: Depuy PFC Sigma, Posterior Stabilized Femural component size 3, Tibia size rotating platform component size 3, Patella polyethylene 3-peg oval button size 38, with a 15 mm polyethylene insert.   3PREOPERATIVE INDICATIONS:  Denise Mcintyre is an 55 y.o. female who has a diagnosis of left knee osteoarthritis and elected for a left total knee arthroplasty after failing nonoperative treatment, including cortisone injections who has significant impairment of her activities of daily living including ambulation due to her knee pain.  Radiographs have demonstrated tricompartmental osteoarthritis joint space narrowing, osteophytes, subchondral sclerosis and cyst formation.  The risks, benefits, and alternatives were discussed at length including but not limited to the risks of infection, bleeding, nerve or blood vessel injury, knee stiffness, fracture, dislocation, loosening or failure of the hardware and the need for further surgery. Medical risks include but not limited to DVT and pulmonary embolism, myocardial infarction, stroke, pneumonia, respiratory failure and death. I discussed these risks with the patient in my office prior to the date of surgery. They understood these risks and were willing to proceed.  OPERATIVE DESCRIPTION:  The patient was brought to the operative room and placed in a supine position after undergoing placement of a spinal anesthetic.  A Foley catheter was placed.  IV antibiotics were given. Patient received clindamycin due to an allergy to penicillin. The lower extremity was prepped and draped in the usual sterile fashion.  A time out was performed to verify the  patient's name, date of birth, medical record number, correct site of surgery and correct procedure to be performed. The timeout was also used to confirm the patient received antibiotics and that appropriate instruments, implants and radiographs studies were available in the room.  The leg was elevated and exsanguinated with an Esmarch and the tourniquet was inflated to 275 mmHg for 120 minutes..  A midline incision was made over the left knee. Full-thickness skin flaps were developed. A medial parapatellar arthrotomy was then made and the patella everted and the knee was brought into 90 of flexion. Hoffa's fat pad along with the cruciate ligaments and medial and lateral menisci were resected.   The distal femoral intramedullary canal was opened with a drill and the intramedullary distal femoral cutting jig was inserted into the femoral canal pinned into position. It was set at 5 degrees resecting 10 mm off the distal femur.  Care was taken to protect the collateral ligaments during distal femoral resection.  The distal femoral resection was performed with an oscillating saw. The femoral cutting guide was then removed.  The extramedullary tibial cutting guide was then placed using the anterior tibial crest and second ray of the foot as a references.  The tibial cutting guide was adjusted to allow for appropriate posterior slope.  The tibial cutting block was pinned into position. The slotted stylus was used to measure the proximal tibial resection of 10 mm off the high lateral side.  The tibial long rod alignment guide was then used to confirm position of the cutting block. A third cross pin through the tibial cutting block was then drilled into position to allow for rotational stability. Care was taken during the tibial resection to protect  the medial and collateral ligaments.  The resected tibial bone was removed along with the posterior horns of the menisci.  The PCL was sacrificed.  Extension gap was  measured with a spacer block and alignment and extension was confirmed using a long alignment rod.  The attention was then turned back to the femur. The posterior referencing distal femoral sizing guide was applied to the distal femur.  The femur was sized to be a 4. Rotation of the referencing guide was checked with the epicondylar axis and Whitesides line. Then the 4-in-1 cutting jig was then applied to the distal femur. A stylus was used to confirm that the anterior femur would not be notched.   Then the anterior, posterior and chamfer femoral cuts were then made with an oscillating saw.  All posterior osteophytes were removed.  The flexion gap was then measured with a flexion spacer block and long alignment rod and was found to be symmetric with the extension gap and perpendicular to mechanical axis of the tibia.  The distal femoral preparation was completed by performing the posterior stabilized box cut using the cutting block. The entry site for the intramedullary femoral guide was filled with autologous bone graft from bone previously resected earlier in the case.  The proximal tibia plateau was then sized with trial trays. The best coverage was achieved with a size 3. This tibial tray was then pinned into position. The proximal tibia was then prepared with the reamer and keel punch.  After tibial preparation was completed, all trial components were inserted with polyethylene trials.  The knee was found to have excellent balance and full motion with a size 15 mm tibial polyethylene insert.    The attention was then turned to preparation of the patella. The thickness of the patella was measured with a caliper, the diameter measured with the patella templates.  The patella resection was then made with an oscillating saw using the patella cutting guide.  After patella osteotomy it was noted the patient had a fracture of the lateral most aspect of the patella.  This involved approximately 15-20% of the  patella. The fracture line ran vertically. The final patellar thickness 12 mm.  3 peg holes for the patella component were then drilled. The fracture did not involve the patella peg holes. The trial patella was then placed. Knee was taken through a full range of motion and deemed to be stable with the trial components. The decision was made to place a 0.062 K wire across the fracture. This allowed for anatomic reduction of the fracture. The K wire was bent on either side of the patella and cut with a wire cutter to avoid migration of the Kwire. All trial components were then removed. The knee capsule was then injected with Exparel and then a mixture of Marcaine, morphine and Toradol.. The joint was copiously irrigated with pulse lavage.  The final total knee arthroplasty components were then cemented into place with a 15 mm trial polyethylene insert and all excess methylmethacrylate was removed.  The joint was again copiously irrigated. After the cement had hardened the knee was again taken through a full range of motion. It was felt to be most stable with the 15 mm tibial polyethylene insert. The actual tibial polyethylene insert was then placed.   The knee was taken through a range of motion and the patella tracked well and the knee was again irrigated copiously.  Patient's fracture was stable throughout the range of motion of the left  knee.  An Autovac drain was placed. The 2 drain limbs were brought out through the superior lateral knee. The medial arthrotomy was closed with #1 Ethibond. The subcutaneous tissue closed with 0 and 2-0 vicryl, and skin approximated with staples.  A dry sterile and compressive dressing was applied.  A Polar Care was applied to the operative knee along with a knee immobilizer.  The patient was awakened and brought to the PACU in stable and satisfactory condition.  All sharp, lap and instrument counts were correct at the conclusion the case.   Total tourniquet time was 120  minutes.

## 2016-01-22 NOTE — Progress Notes (Signed)
PHARMACIST - PHYSICIAN ORDER COMMUNICATION  CONCERNING: P&T Medication Policy on Herbal Medications  DESCRIPTION:  This patient's order for:  Cinnamon  has been noted.  This product(s) is classified as an "herbal" or natural product. Due to a lack of definitive safety studies or FDA approval, nonstandard manufacturing practices, plus the potential risk of unknown drug-drug interactions while on inpatient medications, the Pharmacy and Therapeutics Committee does not permit the use of "herbal" or natural products of this type within [place].   ACTION TAKEN: The pharmacy department is unable to verify this order at this time and your patient has been informed of this safety policy. Please reevaluate patient's clinical condition at discharge and address if the herbal or natural product(s) should be resumed at that time.  

## 2016-01-22 NOTE — NC FL2 (Signed)
[place] MEDICAID FL2 LEVEL OF CARE SCREENING TOOL     IDENTIFICATION  Patient Name: Denise Mcintyre Birthdate: 07-Apr-1961 Sex: female Admission Date (Current Location): 01/22/2016  [place] and [place] Number:  Chiropodist and Address:  [facility], [address], [place], [place] [address]      Provider Number: [id number]  Attending Physician Name and Address:  Juanell Fairly, MD  Relative Name and Phone Number:       Current Level of Care: Hospital Recommended Level of Care: Skilled Nursing Facility Prior Approval Number:    Date Approved/Denied:   PASRR Number:  [id number])  Discharge Plan: SNF    Current Diagnoses: Patient Active Problem List   Diagnosis Date Noted  . S/P total knee arthroplasty 01/22/2016   Hyperlipidemia, unspecified    Hypertension    Tobacco abuse    Depression, unspecified    Deafness in right ear       Orientation RESPIRATION BLADDER Height & Weight     Self, Time, Situation, Place  Normal Continent Weight:   Height:  5' 5.5" (166.4 cm)  BEHAVIORAL SYMPTOMS/MOOD NEUROLOGICAL BOWEL NUTRITION STATUS   (none )  (none ) Continent Diet (Diet: Regular )  AMBULATORY STATUS COMMUNICATION OF NEEDS Skin   Extensive Assist Verbally Surgical wounds (Incision: Left Knee)                       Personal Care Assistance Level of Assistance  Bathing, Feeding, Dressing Bathing Assistance: Limited assistance Feeding assistance: Independent Dressing Assistance: Limited assistance     Functional Limitations Info  Sight, Hearing, Speech Sight Info: Adequate Hearing Info: Impaired Speech Info: Adequate    SPECIAL CARE FACTORS FREQUENCY  PT (By licensed PT), OT (By licensed OT)     PT Frequency:  (5) OT Frequency:  (5)            Contractures      Additional Factors Info  Code Status, Allergies Code Status Info:  (Full Code. ) Allergies Info:  (Penicillins. )            Current Medications (01/22/2016):  This is the current hospital active medication list Current Facility-Administered Medications  Medication Dose Route Frequency Provider Last Rate Last Dose  . 0.9 %  sodium chloride infusion   Intravenous Continuous Juanell Fairly, MD 75 mL/hr at 01/22/16 1216    . acetaminophen (TYLENOL) tablet 650 mg  650 mg Oral Q6H PRN Juanell Fairly, MD       Or  . acetaminophen (TYLENOL) suppository 650 mg  650 mg Rectal Q6H PRN Juanell Fairly, MD      . alum & mag hydroxide-simeth (MAALOX/MYLANTA) 200-200-20 MG/5ML suspension 30 mL  30 mL Oral Q4H PRN Juanell Fairly, MD      . amLODipine (NORVASC) tablet 10 mg  10 mg Oral Daily Juanell Fairly, MD      . ARIPiprazole (ABILIFY) tablet 2 mg  2 mg Oral Daily Juanell Fairly, MD      . aspirin EC tablet 81 mg  81 mg Oral Daily Juanell Fairly, MD      . atorvastatin (LIPITOR) tablet 20 mg  20 mg Oral QHS Juanell Fairly, MD      . bisacodyl (DULCOLAX) EC tablet 5 mg  5 mg Oral Daily PRN Juanell Fairly, MD      . Melene Muller ON 01/23/2016] buPROPion (WELLBUTRIN XL) 24 hr tablet 300 mg  300 mg Oral Daily Juanell Fairly, MD      .  celecoxib (CELEBREX) capsule 200 mg  200 mg Oral Q12H Juanell FairlyKevin Krasinski, MD   200 mg at 01/22/16 1513  . clindamycin (CLEOCIN) 600 MG/50ML IVPB           . clindamycin (CLEOCIN) IVPB 600 mg  600 mg Intravenous Q6H Juanell FairlyKevin Krasinski, MD   600 mg at 01/22/16 1514  . diphenhydrAMINE (BENADRYL) 12.5 MG/5ML elixir 12.5-25 mg  12.5-25 mg Oral Q4H PRN Juanell FairlyKevin Krasinski, MD      . docusate sodium (COLACE) capsule 100 mg  100 mg Oral BID Juanell FairlyKevin Krasinski, MD   100 mg at 01/22/16 1514  . [START ON 01/23/2016] enoxaparin (LOVENOX) injection 30 mg  30 mg Subcutaneous Q12H Juanell FairlyKevin Krasinski, MD      . famotidine (PEPCID) 20 MG tablet           . gabapentin (NEURONTIN) capsule 300 mg  300 mg Oral TID Juanell FairlyKevin Krasinski, MD   300 mg at 01/22/16 1513  . [START ON 01/23/2016] hydrochlorothiazide (HYDRODIURIL) tablet 25 mg  25  mg Oral Daily Juanell FairlyKevin Krasinski, MD      . HYDROmorphone (DILAUDID) injection 1 mg  1 mg Intravenous Q2H PRN Juanell FairlyKevin Krasinski, MD      . irbesartan (AVAPRO) tablet 300 mg  300 mg Oral Daily Juanell FairlyKevin Krasinski, MD      . loratadine (CLARITIN) tablet 10 mg  10 mg Oral Daily Juanell FairlyKevin Krasinski, MD      . magnesium citrate solution 1 Bottle  1 Bottle Oral Once PRN Juanell FairlyKevin Krasinski, MD      . magnesium hydroxide (MILK OF MAGNESIA) suspension 30 mL  30 mL Oral Daily PRN Juanell FairlyKevin Krasinski, MD      . menthol-cetylpyridinium (CEPACOL) lozenge 3 mg  1 lozenge Oral PRN Juanell FairlyKevin Krasinski, MD       Or  . phenol (CHLORASEPTIC) mouth spray 1 spray  1 spray Mouth/Throat PRN Juanell FairlyKevin Krasinski, MD      . methocarbamol (ROBAXIN) tablet 500 mg  500 mg Oral Q6H PRN Juanell FairlyKevin Krasinski, MD       Or  . methocarbamol (ROBAXIN) 500 mg in dextrose 5 % 50 mL IVPB  500 mg Intravenous Q6H PRN Juanell FairlyKevin Krasinski, MD      . metoCLOPramide (REGLAN) tablet 5-10 mg  5-10 mg Oral Q8H PRN Juanell FairlyKevin Krasinski, MD       Or  . metoCLOPramide (REGLAN) injection 5-10 mg  5-10 mg Intravenous Q8H PRN Juanell FairlyKevin Krasinski, MD      . ondansetron [facility](ZOFRAN) tablet 4 mg  4 mg Oral Q6H PRN Juanell FairlyKevin Krasinski, MD       Or  . ondansetron [facility](ZOFRAN) injection 4 mg  4 mg Intravenous Q6H PRN Juanell FairlyKevin Krasinski, MD      . oxyCODONE (Oxy IR/ROXICODONE) immediate release tablet 10-15 mg  10-15 mg Oral Q3H PRN Juanell FairlyKevin Krasinski, MD   10 mg at 01/22/16 1513  . zolpidem (AMBIEN) tablet 5 mg  5 mg Oral QHS PRN Juanell FairlyKevin Krasinski, MD         Discharge Medications: Please see discharge summary for a list of discharge medications.  Relevant Imaging Results:  Relevant Lab Results:   Additional Information  (SSN: [id number])  Delissa Silba, Darleen CrockerBailey M, LCSW

## 2016-01-22 NOTE — Anesthesia Procedure Notes (Signed)
Spinal  Patient location during procedure: OR Start time: 01/22/2016 7:25 AM End time: 01/22/2016 7:31 AM Staffing Anesthesiologist: ,  Performed: anesthesiologist  Preanesthetic Checklist Completed: patient identified, site marked, surgical consent, pre-op evaluation, timeout performed, IV checked, risks and benefits discussed and monitors and equipment checked Spinal Block Patient position: sitting Prep: ChloraPrep Patient monitoring: heart rate, continuous pulse ox, blood pressure and cardiac monitor Approach: midline Location: L4-5 Injection technique: single-shot Needle Needle type: Whitacre and Introducer  Needle gauge: 24 G Needle length: 9 cm Assessment Sensory level: T8 Additional Notes Negative paresthesia. Negative blood return. Positive free-flowing CSF. Expiration date of kit checked and confirmed. Patient tolerated procedure well, without complications.       

## 2016-01-22 NOTE — Anesthesia Postprocedure Evaluation (Signed)
Anesthesia Post Note  Patient: Council MechanicDeborah C Modisette  Procedure(s) Performed: Procedure(s) (LRB): TOTAL KNEE ARTHROPLASTY (Left)  Patient location during evaluation: PACU Anesthesia Type: Spinal Level of consciousness: oriented and awake and alert Pain management: pain level controlled Vital Signs Assessment: post-procedure vital signs reviewed and stable Respiratory status: spontaneous breathing, respiratory function stable and nonlabored ventilation Cardiovascular status: blood pressure returned to baseline and stable Postop Assessment: no headache, no backache and spinal receding Anesthetic complications: no    Last Vitals:  Vitals:   01/22/16 1050 01/22/16 1105  BP: 115/72 113/68  Pulse: 86 74  Resp: 12 12  Temp: 36.4 C     Last Pain:  Vitals:   01/22/16 0621  TempSrc: Oral                 Aadil Sur

## 2016-01-22 NOTE — Progress Notes (Signed)
  Subjective:  POST-OP CHECK:  Status post left total knee arthroplasty. Patient reports left knee pain as mild.  Patient started perform physical therapy. She is up out of bed to a chair eating dinner. Patient has no complaints.  Objective:   VITALS:   Vitals:   01/22/16 1259 01/22/16 1400 01/22/16 1407 01/22/16 1514  BP: (!) 97/59  (!) 101/55 (!) 95/55  Pulse: 74  68 66  Resp: 18  18 16   Temp: 97.9 F (36.6 C)  97.7 F (36.5 C) 97.7 F (36.5 C)  TempSrc: Oral  Oral Oral  SpO2: 96%  96% 99%  Height:  5' 5.5" (1.664 m)      PHYSICAL EXAM:  Left knee: Patient's dressing is clean dry and intact. She has intact sensation to light touch throughout the bilateral lower extremities and palpable pedal pulses. She has intact motor function distally in both lower extremities as well. Patient's spinal block appears to of worn off.   LABS  No results found for this or any previous visit (from the past 24 hour(s)).  Dg Knee Left Port  Result Date: 01/22/2016 CLINICAL DATA:  Status post total knee arthroplasty. EXAM: PORTABLE LEFT KNEE - 1-2 VIEW COMPARISON:  None. FINDINGS: The femoral and tibial components appear to be well situated. Surgical drains are noted anterior to the distal femur. Other expected postoperative changes are noted in the surrounding tissues. IMPRESSION: Status post left total knee arthroplasty. Electronically Signed   By: Lupita RaiderJames  Green Jr, M.D.   On: 01/22/2016 11:20    Assessment/Plan: Day of Surgery   Active Problems:   S/P total knee arthroplasty  Patient doing well postop. I splinted the patient that a small fracture of the patella was noted patella osteotomy. She is aware that a pin was placed in the patella to keep the fracture reduced. It involves the lateral most aspect of the patella and in no way destabilized the patella implant.  I reviewed the postoperative x-ray. Components are well positioned. Patient be weightbearing as tolerated in left lower  extremity. She will complete 24 hours postop antibiotics. Her Hemovac drain and Foley catheter will be removed tomorrow. She is encouraged to use incentive spirometry while awake. Labs will be checked in the morning.    Juanell FairlyKRASINSKI, Estephanie Hubbs , MD 01/22/2016, 5:31 PM

## 2016-01-22 NOTE — Anesthesia Preprocedure Evaluation (Signed)
Anesthesia Evaluation  Patient identified by MRN, date of birth, ID band Patient awake    Reviewed: Allergy & Precautions, NPO status , Patient's Chart, lab work & pertinent test results  History of Anesthesia Complications Negative for: history of anesthetic complications  Airway Mallampati: IV  TM Distance: >3 FB   Mouth opening: Limited Mouth Opening  Dental  (+) Partial Lower   Pulmonary neg sleep apnea, neg COPD, former smoker,    breath sounds clear to auscultation- rhonchi (-) wheezing      Cardiovascular hypertension, Pt. on medications (-) CAD and (-) Past MI  Rhythm:Regular Rate:Normal - Systolic murmurs and - Diastolic murmurs    Neuro/Psych PSYCHIATRIC DISORDERS Depression negative neurological ROS     GI/Hepatic negative GI ROS, Neg liver ROS,   Endo/Other  negative endocrine ROSneg diabetes  Renal/GU negative Renal ROS     Musculoskeletal negative musculoskeletal ROS (+)   Abdominal (+) + obese,   Peds  Hematology negative hematology ROS (+)   Anesthesia Other Findings Past Medical History: No date: Depression No date: Hypertension   Reproductive/Obstetrics                             Anesthesia Physical Anesthesia Plan  ASA: II  Anesthesia Plan: Spinal and MAC   Post-op Pain Management:    Induction:   Airway Management Planned:   Additional Equipment:   Intra-op Plan:   Post-operative Plan:   Informed Consent: I have reviewed the patients History and Physical, chart, labs and discussed the procedure including the risks, benefits and alternatives for the proposed anesthesia with the patient or authorized representative who has indicated his/her understanding and acceptance.   Dental advisory given  Plan Discussed with: CRNA and Anesthesiologist  Anesthesia Plan Comments:         Lab Results  Component Value Date   WBC 8.9 01/14/2016   HGB 13.7  01/14/2016   HCT 39.6 01/14/2016   MCV 88.5 01/14/2016   PLT 216 01/14/2016    Anesthesia Quick Evaluation

## 2016-01-22 NOTE — Transfer of Care (Signed)
Immediate Anesthesia Transfer of Care Note  Patient: Denise Mcintyre  Procedure(s) Performed: Procedure(s): TOTAL KNEE ARTHROPLASTY (Left)  Patient Location: PACU  Anesthesia Type:Spinal  Level of Consciousness: awake, alert  and oriented  Airway & Oxygen Therapy: Patient Spontanous Breathing and Patient connected to nasal cannula oxygen  Post-op Assessment: Report given to RN and Post -op Vital signs reviewed and stable  Post vital signs: Reviewed and stable  Last Vitals:  Vitals:   01/22/16 0621 01/22/16 1050  BP: 134/69 115/72  Pulse: 61 86  Resp: 18 12  Temp: 36.7 C 36.4 C    Last Pain:  Vitals:   01/22/16 0621  TempSrc: Oral         Complications: No apparent anesthesia complications

## 2016-01-22 NOTE — Progress Notes (Signed)
PT reports pt did well, ambulated 10 feet, is currently up to chair. Pt reports pain well controlled with Oxy 10mg . Incentive spirometer at bedside, pt educated and demonstrated learning effective. Tolerating clear liquid diet, advanced to regular diet.

## 2016-01-23 ENCOUNTER — Encounter: Payer: Self-pay | Admitting: Orthopedic Surgery

## 2016-01-23 LAB — BASIC METABOLIC PANEL
Anion gap: 7 (ref 5–15)
BUN: 17 mg/dL (ref 6–20)
CALCIUM: 8.4 mg/dL — AB (ref 8.9–10.3)
CO2: 27 mmol/L (ref 22–32)
Chloride: 100 mmol/L — ABNORMAL LOW (ref 101–111)
Creatinine, Ser: 0.74 mg/dL (ref 0.44–1.00)
GFR calc Af Amer: >60 mL/min (ref >60)
GLUCOSE: 162 mg/dL — AB (ref 65–99)
Potassium: 3.5 mmol/L (ref 3.5–5.1)
Sodium: 134 mmol/L — ABNORMAL LOW (ref 135–145)

## 2016-01-23 LAB — CBC
HCT: 30 % — ABNORMAL LOW (ref 35.0–47.0)
HEMOGLOBIN: 10.6 g/dL — AB (ref 12.0–16.0)
MCH: 30.9 pg (ref 26.0–34.0)
MCHC: 35.3 g/dL (ref 32.0–36.0)
MCV: 87.6 fL (ref 80.0–100.0)
Platelets: 184 10*3/uL (ref 150–440)
RBC: 3.42 MIL/uL — AB (ref 3.80–5.20)
RDW: 13.6 % (ref 11.5–14.5)
WBC: 11.3 10*3/uL — ABNORMAL HIGH (ref 3.6–11.0)

## 2016-01-23 NOTE — Evaluation (Signed)
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    Clinical Impression   Pt is 55 year old female s/p elective L TKA.  Pt was independent in all ADLs prior to surgery and is eager to return to PLOF.  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 Pt currently requires min assist for LB dressing for LLE only while in seated position due to pain and limited AROM of L knee.  Pt would benefit from instruction in dressing techniques with or without assistive devices for dressing and bathing skills.  Pt would also benefit from recommendations for home modifications to increase safety in the bathroom and prevent falls. Rec using a shower chair with back since she has been using a stool without a back and is at risk for falling. Will continue to educate on AD and set up while in hospital and most likely will not need any HH OT.      Follow Up Recommendations  No OT follow up    Equipment Recommendations   (rec a shower chair with back since she currently has a stool with no back  )    Recommendations for Other Services       Precautions / Restrictions Precautions Precautions: Fall Required Braces or Orthoses: Knee Immobilizer - Left Knee Immobilizer - Left: On when out of bed or walking Restrictions Weight Bearing Restrictions: Yes LLE Weight Bearing: Weight bearing as tolerated      Mobility Bed Mobility               General bed mobility comments: Pt in recliner on arrival, not tested  Transfers Overall transfer level: Needs assistance Equipment used: Rolling walker (2 wheeled) Transfers: Sit to/from Stand Sit to Stand: Min guard         General transfer comment: Pt needing cuing for  both foot and hand placement, sequencing and appropriate AD use.    Balance                                            ADL Overall ADL's : Needs assistance/impaired Eating/Feeding: Independent;Set up   Grooming: Wash/dry hands;Wash/dry face;Oral care;Applying deodorant;Brushing hair;Independent;Set up           Upper Body Dressing : Independent;Set up   Lower Body Dressing: Minimal assistance;Set up Lower Body Dressing Details (indicate cue type and reason): independent for RLE and min assist for LLE                 General ADL Comments: Pt is doing well with ADLs but needs min assist with dressing LLE. She has a Sports administratorreacher at home and most likely will progress to being able to complete LB dressing without AD or assist upon DC home. Her sister in law Denise Mcintyre will be checking on her frequently as well as several other family members and friends since she lives at home alone.     Vision     Perception     Praxis      Pertinent Vitals/Pain Pain Assessment: 0-10 Pain Score: 4  Pain Location: L Knee Pain Descriptors /  Indicators: Operative site guarding Pain Intervention(s): Limited activity within patient's tolerance;Monitored during session;Premedicated before session;Ice applied     Hand Dominance Right   Extremity/Trunk Assessment Upper Extremity Assessment Upper Extremity Assessment: Overall WFL for tasks assessed   Lower Extremity Assessment Lower Extremity Assessment: Defer to PT evaluation       Communication Communication Communication: No difficulties   Cognition Arousal/Alertness: Awake/alert Behavior During Therapy: WFL for tasks assessed/performed Overall Cognitive Status: Within Functional Limits for tasks assessed                     General Comments       Exercises Exercises: Total Joint     Shoulder Instructions      Home Living Family/patient expects to be discharged to:: Private residence Living Arrangements:  Alone Available Help at Discharge: Family;Available PRN/intermittently Type of Home: House Home Access: Stairs to enter [employer] of Steps: 2 Entrance Stairs-Rails: None Home Layout: One level     Bathroom Shower/Tub: Tub/shower unit Shower/tub characteristics: Curtain Firefighter: Handicapped height Bathroom Accessibility: Yes How Accessible: Accessible via walker Home Equipment: Walker - 2 wheels;Cane - single point;Bedside commode;Adaptive equipment Adaptive Equipment: Reacher        Prior Functioning/Environment Level of Independence: Independent with assistive device(s)        Comments: 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  OT Goal Formulation: With patient/family Time For Goal Achievement: 02/06/16 Potential to Achieve Goals: Good ADL Goals Pt Will Perform Lower Body Dressing: Independently;with set-up;sit to/from stand (most likely will not need AD) Pt Will Transfer to Toilet: Independently;regular height toilet  OT Frequency: Min 1X/week   Barriers to D/C:            Co-evaluation              End of Session    Activity Tolerance: Patient tolerated treatment well Patient left: in chair;with call bell/phone within reach;with family/visitor present;with chair alarm set   Time: 1610-9604 OT Time Calculation (min): 33 min Charges:  OT General Charges $OT Visit: 1 Procedure OT Evaluation $OT Eval Low Complexity: 1 Procedure OT Treatments $Self Care/Home  Management : 8-22 mins G-Codes:     Susanne Borders, OTR/L ascom [phone] 01/23/16, 11:42 AM

## 2016-01-23 NOTE — Care Management (Signed)
Patient presents from home for elective knee replacement.  She has a rolling walker and commode chair at home.  Agency preference for home health is [facility].  Referral called for home physical therapy.  OT has indicated that service is not needed.  Patient to discharge home on Lovenox/Enoxaparin injections for 4 weeks.  Clarified order with attending: 40mg  sq qd x 4 weeks.  [facility] [address].  Copay will be 25 dollars.  Does not have full prescription in stock so will fill prescription with what is on hand and complete the amount on 8/28.  Informed patient of above.  [facility] physical [facility]

## 2016-01-23 NOTE — Progress Notes (Signed)
  Subjective:  POD #1  S/p left TKA.  Patient reports left knee pain as minimal.  Patient states that she did well with physical therapy today and was even able to go up and down stairs. She has not yet had a bowel movement.  Objective:   VITALS:   Vitals:   01/22/16 1930 01/22/16 2351 01/23/16 0408 01/23/16 1452  BP: (!) 97/57 (!) 104/56 (!) 112/52 (!) 102/55  Pulse: 68 69 79 86  Resp:  16 16 18   Temp: 97.6 F (36.4 C) 97.9 F (36.6 C) 97.9 F (36.6 C) 98.4 F (36.9 C)  TempSrc: Oral Oral Oral Oral  SpO2: 94% 100% 96% 95%  Height:        PHYSICAL EXAM:  Left Knee:  Patient's dressing is clean dry and intact. Polar Care is in place. She has no calf tenderness. She has intact motor function distally and can flex and extend her toes and dorsiflex and plantarflex her ankle. She has palpable pedal pulses and intact sensation light touch throughout the left lower extremity.   LABS  Results for orders placed or performed during the hospital encounter of 01/22/16 (from the past 24 hour(s))  CBC     Status: Abnormal   Collection Time: 01/23/16  4:14 AM  Result Value Ref Range   WBC 11.3 (H) 3.6 - 11.0 K/uL   RBC 3.42 (L) 3.80 - 5.20 MIL/uL   Hemoglobin 10.6 (L) 12.0 - 16.0 g/dL   HCT 44.030.0 (L) 10.235.0 - 72.547.0 %   MCV 87.6 80.0 - 100.0 fL   MCH 30.9 26.0 - 34.0 pg   MCHC 35.3 32.0 - 36.0 g/dL   RDW 36.613.6 44.011.5 - 34.714.5 %   Platelets 184 150 - 440 K/uL  Basic metabolic panel     Status: Abnormal   Collection Time: 01/23/16  4:14 AM  Result Value Ref Range   Sodium 134 (L) 135 - 145 mmol/L   Potassium 3.5 3.5 - 5.1 mmol/L   Chloride 100 (L) 101 - 111 mmol/L   CO2 27 22 - 32 mmol/L   Glucose, Bld 162 (H) 65 - 99 mg/dL   BUN 17 6 - 20 mg/dL   Creatinine, Ser 4.250.74 0.44 - 1.00 mg/dL   Calcium 8.4 (L) 8.9 - 10.3 mg/dL   GFR calc non Af Amer >60 >60 mL/min   GFR calc Af Amer >60 >60 mL/min   Anion gap 7 5 - 15    Dg Knee Left Port  Result Date: 01/22/2016 CLINICAL DATA:  Status post  total knee arthroplasty. EXAM: PORTABLE LEFT KNEE - 1-2 VIEW COMPARISON:  None. FINDINGS: The femoral and tibial components appear to be well situated. Surgical drains are noted anterior to the distal femur. Other expected postoperative changes are noted in the surrounding tissues. IMPRESSION: Status post left total knee arthroplasty. Electronically Signed   By: Lupita RaiderJames  Green Jr, M.D.   On: 01/22/2016 11:20    Assessment/Plan: 1 Day Post-Op   Active Problems:   S/P total knee arthroplasty  Patient is doing very well postop. She will continue physical therapy. Her hemoglobin and hematocrit were stable today. Patient will likely be ready for discharge tomorrow given her excellent progress postoperatively    Juanell FairlyKRASINSKI, Tomicka Lover , MD 01/23/2016, 6:01 PM

## 2016-01-23 NOTE — Progress Notes (Signed)
Patient demonstrated correct administration of Lovenox injection.

## 2016-01-23 NOTE — Progress Notes (Signed)
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     PT Comments    Pt does well with PT session and though she was somewhat hesitant to negotiate steps she did well and did not need direct physical assist.  Pt ambulating with consistent cadence and though she has some fatigue generally is safe and confidence. Pt still struggling with against gravity quad initiation (though did some AROM with SAQ) but had over 80 degrees of flexion w/o excessive pain.  Pt doing well.   Follow Up Recommendations  Home health PT     Equipment Recommendations       Recommendations for Other Services       Precautions / Restrictions Precautions Precautions: Fall Required Braces or Orthoses: Knee Immobilizer - Left Knee Immobilizer - Left: On when out of bed or walking Restrictions Weight Bearing Restrictions: Yes LLE Weight Bearing: Weight bearing as tolerated    Mobility  Bed Mobility Overal bed mobility: Modified Independent Bed Mobility: Sit to Supine       Sit to supine: Min guard   General bed mobility comments: Pt able to use R LE to assist L LE into bed, does not need assist to raise LE  Transfers Overall transfer level: Needs assistance Equipment used: Rolling walker (2 wheeled) Transfers: Sit to/from Stand Sit to Stand: Supervision         General transfer comment: Pt able to rise to standing w/o direct assist, shows increased awarness with hand and foot placement but still requiring some minimal cuing  Ambulation/Gait Ambulation/Gait assistance: Supervision Ambulation Distance (Feet): 100 Feet Assistive device: Rolling walker (2 wheeled)       General Gait Details: Pt able to maintain consistent speed and cadence t/o majority of ambulation.   She reports some UE fatigue (as well as R, non-opertive, knee clicking).  Pt was safe and showed good relative confidence with the effort.    Stairs Stairs: Yes Stairs assistance: Min guard Stair Management: Backwards;No rails;With walker Number of Stairs: 3 General stair comments: Pt initially hesitant and needing extra encouragement, but ultimately she was able to negotiate up/down steps with assist to stabilize AD but not direct assist to physically get up stairs.    Wheelchair Mobility    Modified Rankin (Stroke Patients Only)       Balance                                    Cognition Arousal/Alertness: Awake/alert Behavior During Therapy: WFL for tasks assessed/performed Overall Cognitive Status: Within Functional Limits for tasks assessed                      Exercises Total Joint Exercises Ankle Circles/Pumps: 10 reps;Strengthening Quad Sets: 10 reps;Strengthening Gluteal Sets: Strengthening;10 reps Short Arc Quad: 10 reps;AAROM;AROM (Pt able to initiate AROM through partial ROM) Heel Slides: AROM;10 reps Hip ABduction/ADduction: Strengthening;10 reps Goniometric ROM: 84 degrees flexion    General Comments        Pertinent Vitals/Pain Pain Assessment: 0-10 Pain Score: 6  Pain Location: L knee Pain Descriptors / Indicators: Operative site guarding Pain Intervention(s): Limited activity within patient's tolerance;Monitored during session;Premedicated before session;Ice applied  Home Living Family/patient expects to be discharged to:: Private residence Living Arrangements: Alone Available Help at Discharge: Family;Available PRN/intermittently Type of Home: House Home Access: Stairs to enter Entrance Stairs-Rails: None Home Layout: One level Home Equipment: Environmental consultant - 2 wheels;Cane - single point;Bedside commode;Adaptive equipment      Prior Function Level of Independence: Independent with assistive device(s)      Comments: 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  Progress towards PT goals: Progressing toward goals    Frequency  BID    PT Plan Current plan remains appropriate    Co-evaluation             End of Session Equipment Utilized During Treatment: Gait belt;Left knee immobilizer Activity Tolerance: Patient tolerated treatment well;Patient limited by pain;Patient limited by fatigue;Treatment limited secondary to medical complications (Comment) Patient left: with call bell/phone within reach;with bed alarm set     Time: 1610-9604 PT Time Calculation (min) (ACUTE ONLY): 28 min  Charges:  $Gait Training: 8-22 mins $Therapeutic Exercise: 8-22 mins                    G Codes:      Malachi Pro, DPT 01/23/2016, 3:15 PM

## 2016-01-23 NOTE — Progress Notes (Signed)
Physical Therapy Treatment Patient Details Name: Denise Mcintyre MRN: [id number] DOB: May 11, 1961 Today's Date: 01/23/2016    History of Present Illness Carinna Newhart is a 55yo white female who comes to [facility] for elective L TKA after a chronic history of DJD and decline in function.     PT Comments    Pt is able to ambulate into the hallway and shows good ROM despite pain with activities.  Pt is unable to engage quad against gravity but does show ability to do quad sets.  She is very motivated with exercises and is eager to push herself with ambulation.    Follow Up Recommendations  Home health PT     Equipment Recommendations       Recommendations for Other Services       Precautions / Restrictions Precautions Precautions: Fall Required Braces or Orthoses: Knee Immobilizer - Left Restrictions LLE Weight Bearing: Weight bearing as tolerated    Mobility  Bed Mobility               General bed mobility comments: Pt in recliner on arrival, not tested  Transfers Overall transfer level: Needs assistance Equipment used: Rolling walker (2 wheeled) Transfers: Sit to/from Stand Sit to Stand: Min guard         General transfer comment: Pt needing cuing for both foot and hand placement, sequencing and appropriate AD use.  Ambulation/Gait Ambulation/Gait assistance: Min guard Ambulation Distance (Feet): 50 Feet Assistive device: Rolling walker (2 wheeled)       General Gait Details: Pt shows very good effort with ambulation and despite some c/o increased pain and fatigue she is able to maintain relatively consistent with slow cadence and gait.   Stairs            Wheelchair Mobility    Modified Rankin (Stroke Patients Only)       Balance                                    Cognition Arousal/Alertness: Awake/alert Behavior During Therapy: WFL for tasks assessed/performed Overall Cognitive Status: Within Functional Limits for tasks  assessed                      Exercises Total Joint Exercises Ankle Circles/Pumps: 10 reps;Strengthening Quad Sets: 10 reps;Strengthening Gluteal Sets: Strengthening;10 reps Short Arc Quad: 10 reps;AAROM (pt is unable to initiate against gravity movement) Heel Slides: AROM;10 reps Hip ABduction/ADduction: Strengthening;10 reps Straight Leg Raises: PROM;AAROM;10 reps Knee Flexion: PROM;5 reps Goniometric ROM: 2-72    General Comments        Pertinent Vitals/Pain Pain Assessment: 0-10 Pain Score: 4  (increases to 6/10 with activity) Pain Location: L knee    Home Living                      Prior Function            PT Goals (current goals can now be found in the care plan section) Progress towards PT goals: Progressing toward goals    Frequency  BID    PT Plan Current plan remains appropriate    Co-evaluation             End of Session Equipment Utilized During Treatment: Gait belt;Left knee immobilizer Activity Tolerance: Patient tolerated treatment well;Patient limited by pain;Patient limited by fatigue;Treatment limited secondary to medical complications (Comment) Patient left: with call  bell/phone within reach;with nursing/sitter in room     Time: 0825-0850 PT Time Calculation (min) (ACUTE ONLY): 25 min  Charges:  $Gait Training: 8-22 mins $Therapeutic Exercise: 8-22 mins                    G Codes:      Malachi ProGalen R Terilyn Sano, DPT 01/23/2016, 10:00 AM

## 2016-01-23 NOTE — Progress Notes (Signed)
Clinical Social Worker (CSW) received SNF consult. PT is recommending home health. RN Case Manager is aware of above. Please reconsult if future social work needs arise. CSW signing off.   Colbin Jovel, LCSW [phone] 

## 2016-01-24 LAB — CBC
HEMATOCRIT: 28.1 % — AB (ref 35.0–47.0)
HEMOGLOBIN: 9.9 g/dL — AB (ref 12.0–16.0)
MCH: 31.4 pg (ref 26.0–34.0)
MCHC: 35.3 g/dL (ref 32.0–36.0)
MCV: 89 fL (ref 80.0–100.0)
Platelets: 165 10*3/uL (ref 150–440)
RBC: 3.16 MIL/uL — ABNORMAL LOW (ref 3.80–5.20)
RDW: 13.4 % (ref 11.5–14.5)
WBC: 10 10*3/uL (ref 3.6–11.0)

## 2016-01-24 MED ORDER — OXYCODONE HCL 5 MG PO TABS: 5.0000 mg | ORAL_TABLET | ORAL | 0 refills | Status: AC | PRN

## 2016-01-24 MED ORDER — ENOXAPARIN SODIUM 40 MG/0.4ML ~~LOC~~ SOLN: 40.0000 mg | SUBCUTANEOUS | 0 refills | Status: AC

## 2016-01-24 NOTE — Care Management Note (Addendum)
Case Management Note  Patient Details  Name: Council MechanicDeborah C Mcintyre MRN: [id number] Date of Birth: 01-May-1961  Subjective/Objective:    Ref[facility] called to She[facility] Stakesona [facility] PT cancelled per Dr Martha ClanKrasinski states that Denise Mcintyre will get OP-PT at the office beginning this coming Monday. . Ms Denise Mcintyre has a $25 co-pay for Lovenox. She has a RW at home.                 Action/Plan:   Expected Discharge Date:                  Expected Discharge Plan:     In-House Referral:     Discharge planning Services     Post Acute Care Choice:    Choice offered to:     DME Arranged:    DME Agency:     HH Arranged:    HH Agency:     Status of Service:     If discussed at [employer] Length of Stay Meetings, dates discussed:    Additional Comments:  Maijor Hornig A, RN 01/24/2016, 3:04 PM

## 2016-01-24 NOTE — Progress Notes (Signed)
  Subjective:  POD #2 s/p left TKA.  Patient reports left knee pain as minimal. Patient has had a bowel movement. She has no other complaints. Patient states she is ready for discharge home.   Objective:   VITALS:   Vitals:   01/23/16 2006 01/24/16 0456 01/24/16 0734 01/24/16 1432  BP: 124/61 120/65 (!) 124/52 (!) 133/57  Pulse: 80 82 82 85  Resp:   18 18  Temp: 99.1 F (37.3 C) 98.5 F (36.9 C) 97.5 F (36.4 C) 98.2 F (36.8 C)  TempSrc: Oral Oral Oral Oral  SpO2: 100% 93% 96% 99%  Height:        PHYSICAL EXAM:  Left lower extremity: Patient's dressing was changed by me personally today. Her incisions clean dry and intact. There is minimal swelling and no erythema or ecchymosis. There is no drainage from her incision. A new honeycomb dressing was applied along with an Ace wrap for decompression. He is back in her knee immobilizer after her dressing was changed. Distally she has no calf tenderness and a negative Homans sign. She has palpable pedal pulses, intact sensation light touch and can dorsiflex and plantarflex her ankle and flex and extend her toes. She has intact motor function throughout the left lower extremity. Her leg compartments are soft and compressible.   LABS  Results for orders placed or performed during the hospital encounter of 01/22/16 (from the past 24 hour(s))  CBC     Status: Abnormal   Collection Time: 01/24/16  3:11 AM  Result Value Ref Range   WBC 10.0 3.6 - 11.0 K/uL   RBC 3.16 (L) 3.80 - 5.20 MIL/uL   Hemoglobin 9.9 (L) 12.0 - 16.0 g/dL   HCT 16.128.1 (L) 09.635.0 - 04.547.0 %   MCV 89.0 80.0 - 100.0 fL   MCH 31.4 26.0 - 34.0 pg   MCHC 35.3 32.0 - 36.0 g/dL   RDW 40.913.4 81.111.5 - 91.414.5 %   Platelets 165 150 - 440 K/uL    No results found.  Assessment/Plan: 2 Days Post-Op   Active Problems:   S/P total knee arthroplasty  Patient is doing extremely well postop. Her labs remained stable. She has made excellent progress with physical therapy and is ready for  discharge home. She'll follow up with me in the office in 2 weeks for wound check and staple removal. She is due to have her first outpatient physical therapy appointment in my office on Tuesday. Patient be discharged on Lovenox for DVT prophylaxis.    Juanell FairlyKRASINSKI, Baraa Tubbs , MD 01/24/2016, 2:45 PM

## 2016-01-24 NOTE — Progress Notes (Signed)
Physical Therapy Treatment Patient Details Name: Denise Mcintyre MRN: [id number] DOB: [birth year] Today's Date: 01/24/2016    History of Present Illness Denise Mcintyre is a 55yo white female who comes to [facility] for elective L TKA after a chronic history of DJD and decline in function.     PT Comments    Pt did well with PT and was able to circumambulate the nurses' station.  She continues to have difficult with quad control with against gravity acts (SAQ and SLRs) but ultimately is doing functionally very well. Pt continues to work hard t/o session and continues to make good gains.   Follow Up Recommendations  Home health PT     Equipment Recommendations       Recommendations for Other Services       Precautions / Restrictions Precautions Precautions: Fall Restrictions Weight Bearing Restrictions: Yes LLE Weight Bearing: Weight bearing as tolerated    Mobility  Bed Mobility Overal bed mobility: Modified Independent Bed Mobility: Supine to Sit     Supine to sit: Supervision        Transfers Overall transfer level: Modified independent Equipment used: Rolling walker (2 wheeled) Transfers: Sit to/from Stand Sit to Stand: Supervision         General transfer comment: Pt able to rise to standing w/o direct assist, shows increased awarness with hand and foot placement but still requiring some minimal cuing  Ambulation/Gait Ambulation/Gait assistance: Supervision Ambulation Distance (Feet): 200 Feet Assistive device: Rolling walker (2 wheeled)       General Gait Details: Pt did well with ambualtion and did not need any prolonged rest breaks.  She continues to have some hesitancy with fully trusting L LE as well as some R knee arthritic pain/rubbing but overall is safe and stable with ambulation.    Stairs            Wheelchair Mobility    Modified Rankin (Stroke Patients Only)       Balance                                    Cognition  Arousal/Alertness: Awake/alert Behavior During Therapy: WFL for tasks assessed/performed Overall Cognitive Status: Within Functional Limits for tasks assessed                      Exercises Total Joint Exercises Ankle Circles/Pumps: 10 reps;Strengthening Quad Sets: Strengthening;15 reps Gluteal Sets: Strengthening;15 reps Short Arc Quad: 10 reps;AAROM;AROM Heel Slides: AROM;10 reps;Strengthening Hip ABduction/ADduction: 10 reps;Strengthening;AROM Straight Leg Raises:  (unable) Knee Flexion: PROM;5 reps Goniometric ROM: 0-84    General Comments        Pertinent Vitals/Pain Pain Score: 3  Pain Location: L knee    Home Living                      Prior Function            PT Goals (current goals can now be found in the care plan section) Progress towards PT goals: Progressing toward goals    Frequency  BID    PT Plan Current plan remains appropriate    Co-evaluation             End of Session Equipment Utilized During Treatment: Gait belt;Left knee immobilizer Activity Tolerance: Patient tolerated treatment well;Patient limited by pain;Patient limited by fatigue;Treatment limited secondary to medical complications (Comment) Patient left: with  chair alarm set;with call bell/phone within reach     Time: 0902-0931 PT Time Calculation (min) (ACUTE ONLY): 29 min  Charges:  $Gait Training: 8-22 mins $Therapeutic Exercise: 8-22 mins                    G Codes:      Malachi ProGalen R Tobin Witucki, DPT 01/24/2016, 11:31 AM

## 2016-01-24 NOTE — Progress Notes (Signed)
Discharge instructions reviewed with verbal understanding. Narcotic RX given upon discharge. Belongings packed and sent with family per request. Escorted OOF via wc via ortho staff. VSS at this time.

## 2016-01-24 NOTE — Discharge Summary (Signed)
Physician Discharge Summary  Patient ID: Denise Mcintyre MRN: [id number] DOB/AGE: [birth year] 55 y.o.  Admit date: 01/22/2016 Discharge date: 01/24/2016  Admission Diagnoses:  Left primary osteoarthritis of knee <principal problem not specified>  Discharge Diagnoses:  Left primary osteoarthritis of knee Active Problems:   S/P left total knee arthroplasty   Past Medical History:  Diagnosis Date  . Depression   . Hypertension     Surgeries: Procedure(s): TOTAL KNEE ARTHROPLASTY on 01/22/2016   Consultants (if any):   Discharged Condition: Improved  Hospital Course: Denise Mcintyre is an 54 y.o. female who was admitted 01/22/2016 with a diagnosis of primary osteoarthritis of the left knee and went to the operating room on 01/22/2016 and underwent an uncomplicated left total knee arthroplasty.    She was given perioperative antibiotics:  Anti-infectives    Start     Dose/Rate Route Frequency Ordered Stop   01/22/16 1400  clindamycin (CLEOCIN) IVPB 600 mg     600 mg 100 mL/hr over 30 Minutes Intravenous Every 6 hours 01/22/16 1202 01/22/16 2257   01/22/16 0629  clindamycin (CLEOCIN) 600 MG/50ML IVPB    Comments:  Eli Hose: cabinet override      01/22/16 0629 01/22/16 1844   01/22/16 0100  clindamycin (CLEOCIN) IVPB 600 mg     600 mg 100 mL/hr over 30 Minutes Intravenous  Once 01/22/16 0051 01/22/16 0748    .  Patient was admitted to the orthopedic service postoperatively. She was up out of bed the night of surgery with physical therapy. She completed 24 hours of postop antibiotics. On postop day #1 her Foley catheter was removed as was her Hemovac drain. Patient had daily labs drawn which demonstrated a stable hemoglobin and hematocrit. Patient made excellent progress with physical therapy and given her clinical improvement was prepared for discharge home on postop day #2.  She was given sequential compression devices, early ambulation, and lovenox for DVT  prophylaxis.  She benefited maximally from the hospital stay and there were no complications.    Recent vital signs:  Vitals:   01/24/16 0734 01/24/16 1432  BP: (!) 124/52 (!) 133/57  Pulse: 82 85  Resp: 18 18  Temp: 97.5 F (36.4 C) 98.2 F (36.8 C)    Recent laboratory studies:  Lab Results  Component Value Date   HGB 9.9 (L) 01/24/2016   HGB 10.6 (L) 01/23/2016   HGB 13.7 01/14/2016   Lab Results  Component Value Date   WBC 10.0 01/24/2016   PLT 165 01/24/2016   Lab Results  Component Value Date   INR 0.81 01/14/2016   Lab Results  Component Value Date   NA 134 (L) 01/23/2016   K 3.5 01/23/2016   CL 100 (L) 01/23/2016   CO2 27 01/23/2016   BUN 17 01/23/2016   CREATININE 0.74 01/23/2016   GLUCOSE 162 (H) 01/23/2016    Discharge Medications:     Medication List    TAKE these medications   amLODipine 10 MG tablet Commonly known as:  NORVASC Take 10 mg by mouth daily.   ARIPiprazole 2 MG tablet Commonly known as:  ABILIFY Take 2 mg by mouth daily.   aspirin EC 81 MG EC tablet Generic drug:  aspirin Take 81 mg by mouth daily. Swallow whole.   atorvastatin 20 MG tablet Commonly known as:  LIPITOR Take 20 mg by mouth at bedtime.   buPROPion 300 MG 24 hr tablet Commonly known as:  WELLBUTRIN XL Take 300 mg by mouth daily.  Cinnamon 500 MG capsule Take 1,000 mg by mouth daily.   enoxaparin 40 MG/0.4ML injection Commonly known as:  LOVENOX Inject 0.4 mLs (40 mg total) into the skin daily.   EQ ALLERGY RELIEF 180 MG tablet Generic drug:  fexofenadine Take 180 mg by mouth daily.   Fish Oil 1200 MG Caps Take 1,200 mg by mouth daily.   hydrochlorothiazide 25 MG tablet Commonly known as:  HYDRODIURIL Take 25 mg by mouth daily.   oxyCODONE 5 MG immediate release tablet Commonly known as:  Oxy IR/ROXICODONE Take 1-2 tablets (5-10 mg total) by mouth every 3 (three) hours as needed for breakthrough pain.   telmisartan 80 MG tablet Commonly  known as:  MICARDIS Take 80 mg by mouth daily.       Diagnostic Studies: Chest 2 View  Result Date: 01/14/2016 CLINICAL DATA:  Preop for knee replacement EXAM: CHEST  2 VIEW COMPARISON:  None. FINDINGS: No active infiltrate or effusion is seen. Mediastinal and hilar contours are unremarkable. The heart is within normal limits in size. No bony abnormality is seen. IMPRESSION: No active cardiopulmonary disease. Electronically Signed   By: Dwyane Dee M.D.   On: 01/14/2016 08:52   Dg Knee Left Port  Result Date: 01/22/2016 CLINICAL DATA:  Status post total knee arthroplasty. EXAM: PORTABLE LEFT KNEE - 1-2 VIEW COMPARISON:  None. FINDINGS: The femoral and tibial components appear to be well situated. Surgical drains are noted anterior to the distal femur. Other expected postoperative changes are noted in the surrounding tissues. IMPRESSION: Status post left total knee arthroplasty. Electronically Signed   By: Lupita Raider, M.D.   On: 01/22/2016 11:20    Disposition: Final discharge disposition not confirmed  Discharge Instructions    Call MD / Call 911    Complete by:  As directed   If you experience chest pain or shortness of breath, CALL 911 and be transported to the hospital emergency room.  If you develope a fever above 101 F, pus (white drainage) or increased drainage or redness at the wound, or calf pain, call your surgeon's office.   Constipation Prevention    Complete by:  As directed   Drink plenty of fluids.  Prune juice may be helpful.  You may use a stool softener, such as Colace (over the counter) 100 mg twice a day.  Use MiraLax (over the counter) for constipation as needed.   Diet general    Complete by:  As directed   Discharge instructions    Complete by:  As directed   Continue WBAT on the left lower extremity 1 month postop. Continue to use TED stockings until follow-up. Patient may remove them at night for sleep. Elevate the left lower extremity whenever possible. Continue  to use knee immobilizer at night or when lying in bed or when elevating the operative leg. The patient may remove the knee immobilizer to perform exercises or sit in a chair. Continue using the Polar Care for comfort. Keep incision clean and dry. Cover the left knee incision during showers with a plastic bag or Saran wrap. Take lovenox 40 mg once a day for blood clot prevention. Continue to work on knee range of motion exercises at home as instructed by physical therapy. Continue to use a walker for assistance with ambulation until follow-up.   Do not put a pillow under the knee. Place it under the heel.    Complete by:  As directed   Driving restrictions    Complete by:  As directed   No driving for until follow up with Dr. Martha ClanKrasinski in the office.   Increase activity slowly as tolerated    Complete by:  As directed   Lifting restrictions    Complete by:  As directed   No lifting for 12-16 weeks   TED hose    Complete by:  As directed   Use stockings (TED hose) for 2 weeks on both leg(s).  You may remove them at night for sleeping.      Follow-up Information    [facility] .   Why:  Home Health Physical Therapy- This agency is now called [facility] Contact information: [address] [place] [address] [phone]        [facility] .   Why:  Lovenox/Enoxaparin prescription has been called in to this pharmacy.  Co pay will be 25 dollars Contact information: [address]           Signed: Juanell FairlyKRASINSKI, Waverly Tarquinio ,MD 01/24/2016, 2:53 PM

## 2017-02-16 ENCOUNTER — Other Ambulatory Visit: Payer: Self-pay | Admitting: Family Medicine

## 2017-02-16 DIAGNOSIS — Z1231 Encounter for screening mammogram for malignant neoplasm of breast: Secondary | ICD-10-CM

## 2017-02-24 ENCOUNTER — Ambulatory Visit
Admission: RE | Admit: 2017-02-24 | Discharge: 2017-02-24 | Disposition: A | Discharge status: Home / Self Care | Payer: BLUE CROSS/BLUE SHIELD | Source: Ambulatory Visit | Attending: Family Medicine | Admitting: Family Medicine

## 2017-02-24 DIAGNOSIS — Z1231 Encounter for screening mammogram for malignant neoplasm of breast: Secondary | ICD-10-CM | POA: Diagnosis present

## 2019-09-20 ENCOUNTER — Other Ambulatory Visit: Payer: Self-pay | Admitting: Family Medicine

## 2019-09-20 DIAGNOSIS — Z1231 Encounter for screening mammogram for malignant neoplasm of breast: Secondary | ICD-10-CM

## 2019-12-04 ENCOUNTER — Ambulatory Visit
Admission: RE | Admit: 2019-12-04 | Discharge: 2019-12-04 | Disposition: A | Discharge status: Home / Self Care | Payer: PRIVATE HEALTH INSURANCE | Source: Ambulatory Visit | Attending: Family Medicine | Admitting: Family Medicine

## 2019-12-04 DIAGNOSIS — Z1231 Encounter for screening mammogram for malignant neoplasm of breast: Secondary | ICD-10-CM | POA: Diagnosis not present

## 2022-04-16 IMAGING — MG DIGITAL SCREENING BILAT W/ TOMO W/ CAD
6 of 10 series · 6 of 30 positions shown · non-contrast
Comparison: Previous exam(s).

CLINICAL DATA: Screening.

EXAM:
DIGITAL SCREENING BILATERAL MAMMOGRAM WITH TOMO AND CAD

[L MLO synth-2D (1 of 2)]
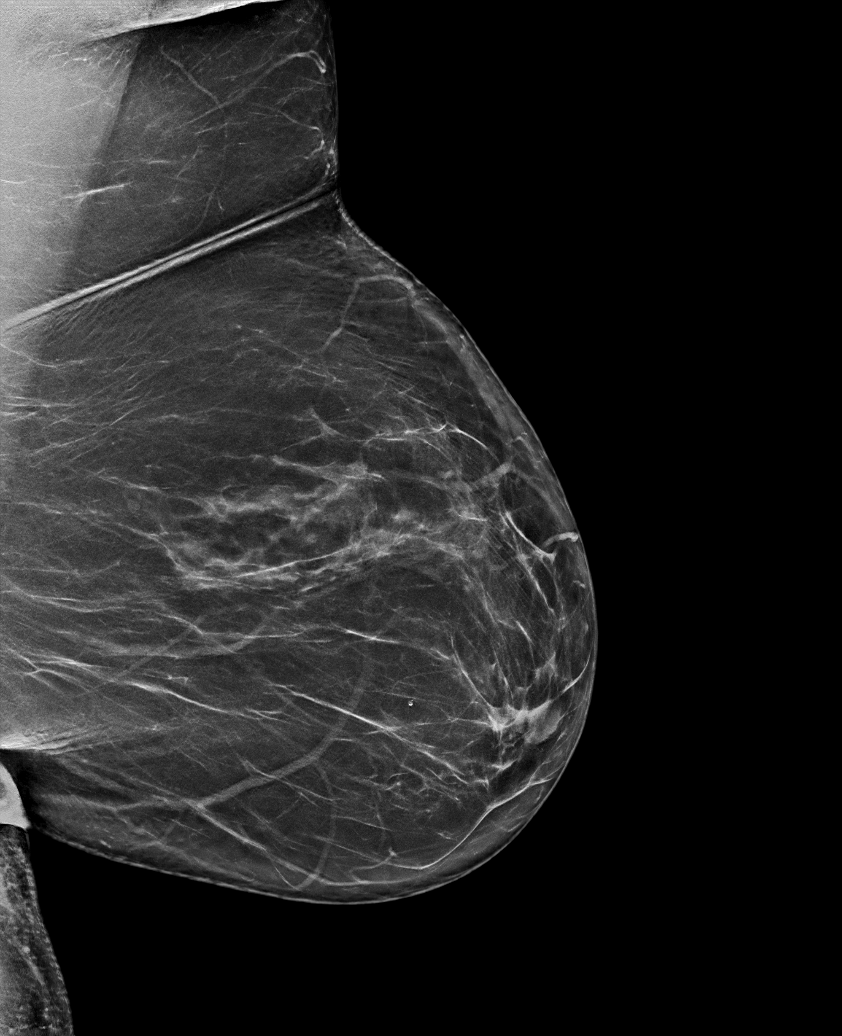

[R CC synth-2D]
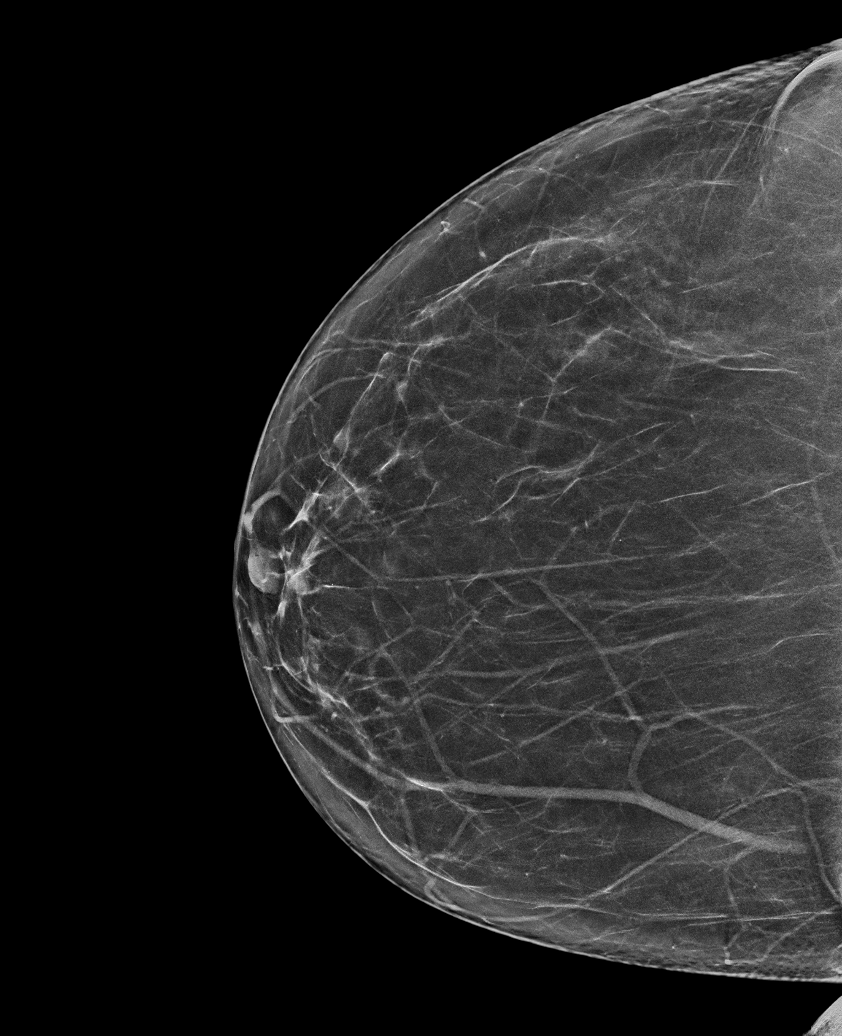

[L CC synth-2D]
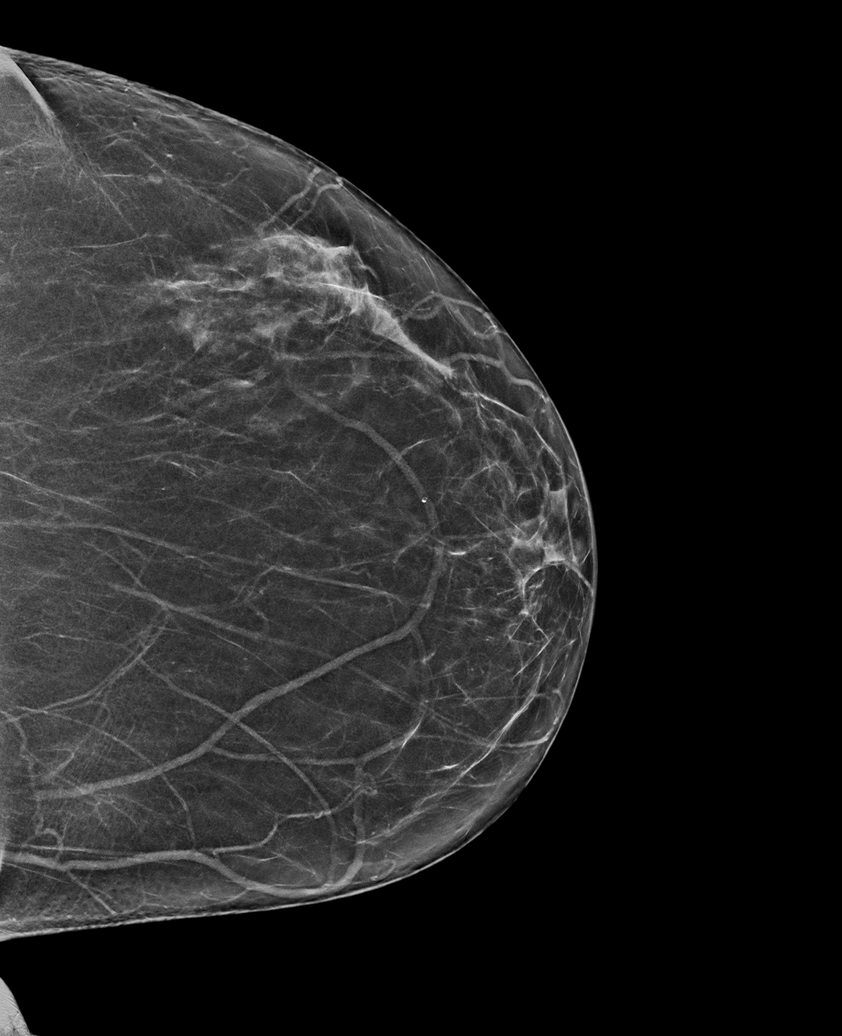

[L MLO synth-2D (2 of 2)]
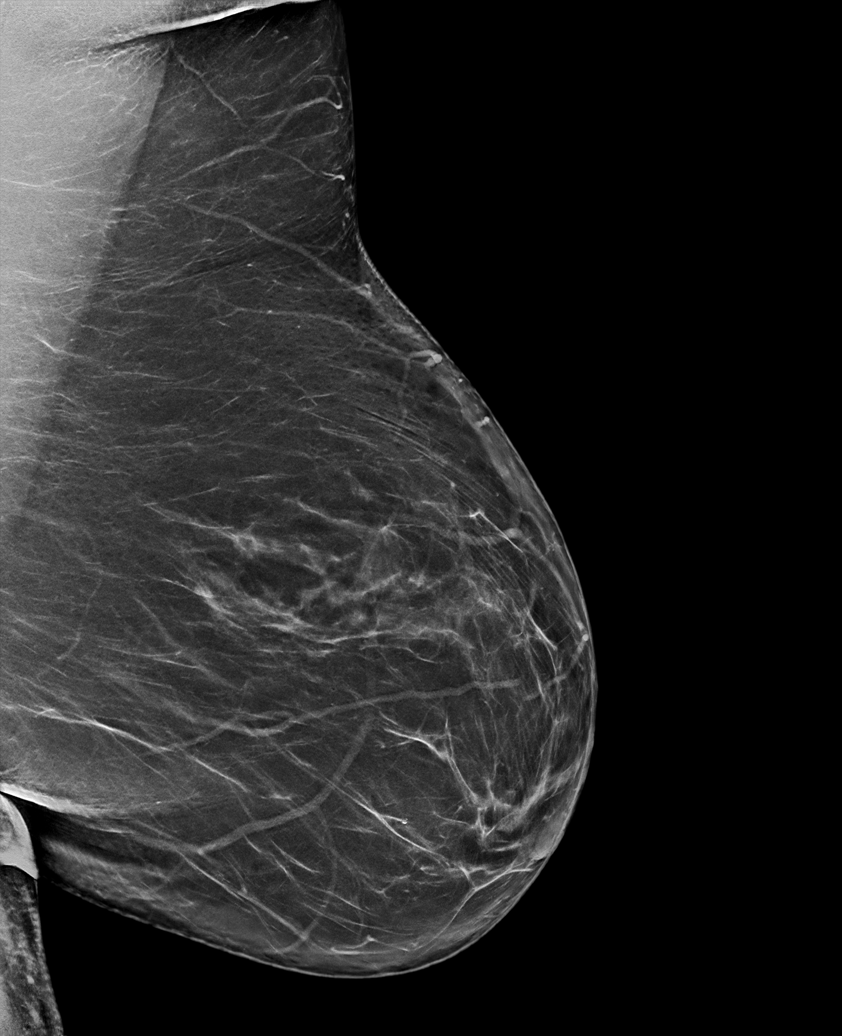

[R MLO synth-2D]
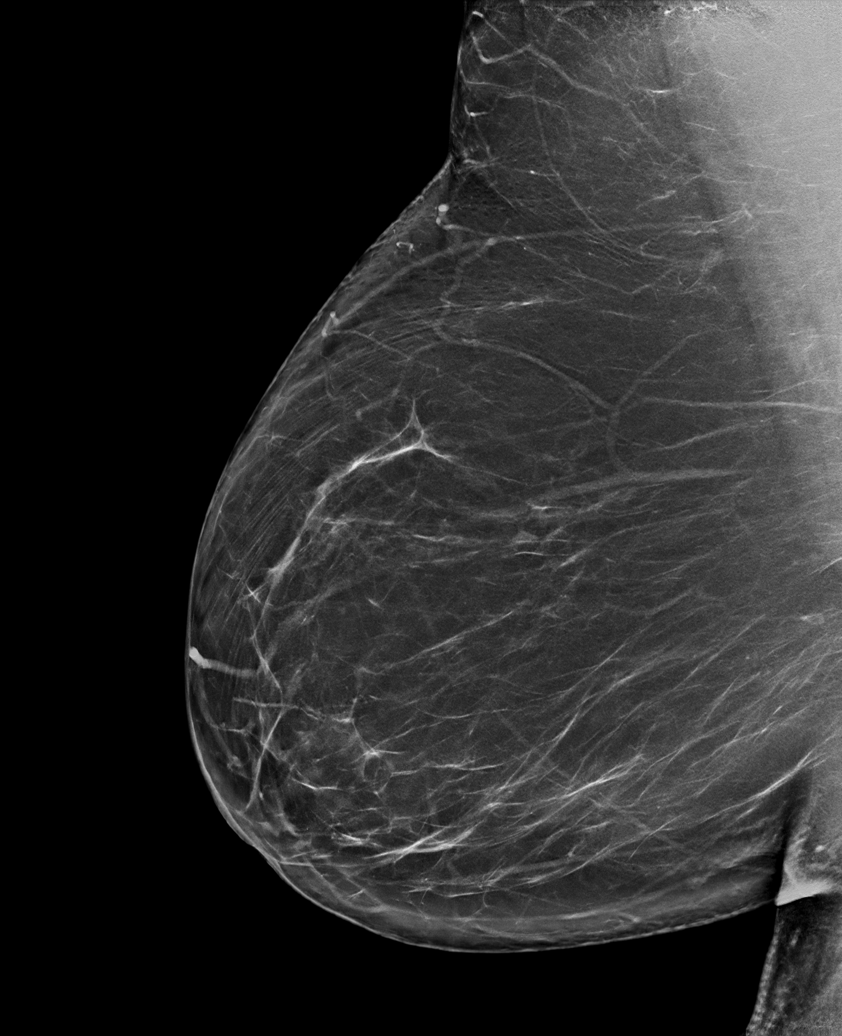

[L MLO tomo · tomo slice 41/80.0]
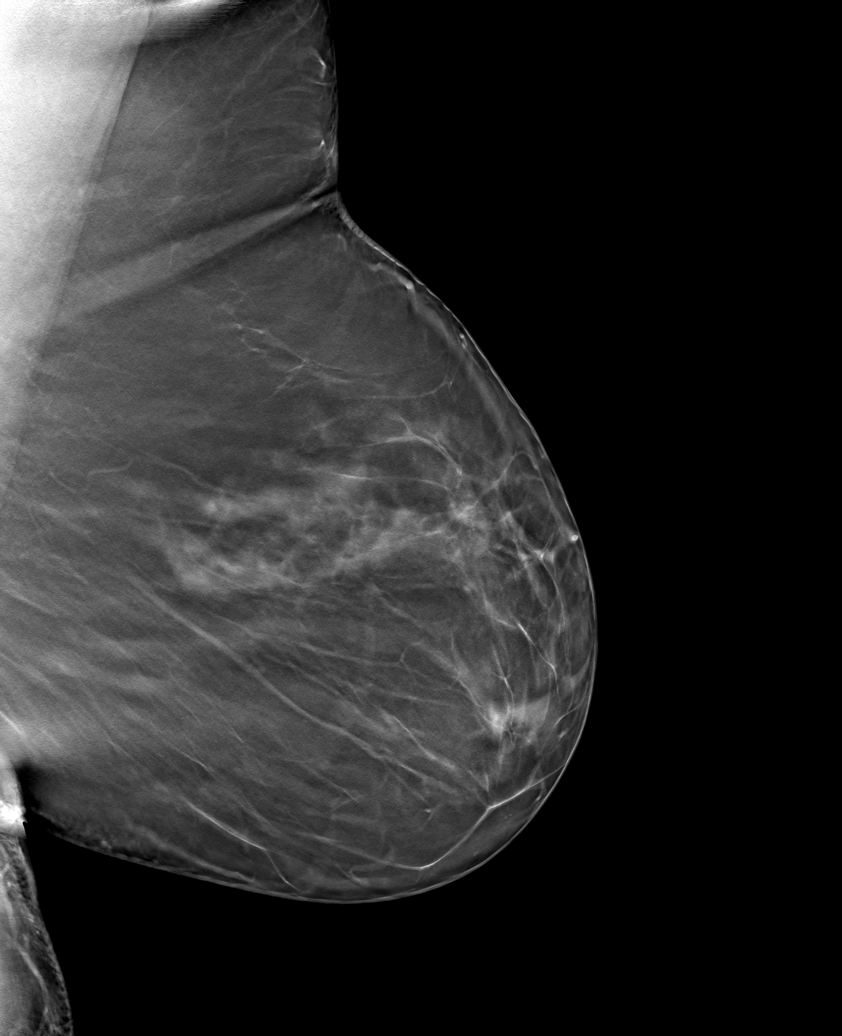

[6 of 30 positions shown; findings below may reference images not displayed]

ACR Breast Density Category b: There are scattered areas of
fibroglandular density.
FINDINGS: There are no findings suspicious for malignancy. Images were
processed with CAD.
IMPRESSION: No mammographic evidence of malignancy. A result letter of this
screening mammogram will be mailed directly to the patient.

RECOMMENDATION:
Screening mammogram in one year. (Code:[id number])

BI-RADS CATEGORY  1: Negative.

## 2022-06-11 ENCOUNTER — Other Ambulatory Visit: Payer: Self-pay | Admitting: Family Medicine

## 2022-06-11 DIAGNOSIS — Z1231 Encounter for screening mammogram for malignant neoplasm of breast: Secondary | ICD-10-CM

## 2022-10-20 ENCOUNTER — Ambulatory Visit
Admission: RE | Admit: 2022-10-20 | Discharge: 2022-10-20 | Disposition: A | Discharge status: Home / Self Care | Payer: BC Managed Care – PPO | Source: Ambulatory Visit | Attending: Family Medicine | Admitting: Family Medicine

## 2022-10-20 DIAGNOSIS — Z1231 Encounter for screening mammogram for malignant neoplasm of breast: Secondary | ICD-10-CM | POA: Insufficient documentation

## 2023-02-27 ENCOUNTER — Emergency Department: Payer: BC Managed Care – PPO

## 2023-02-27 ENCOUNTER — Other Ambulatory Visit: Payer: Self-pay

## 2023-02-27 ENCOUNTER — Emergency Department
Admission: EM | Admit: 2023-02-27 | Discharge: 2023-02-27 | Disposition: A | Discharge status: Home / Self Care | Payer: BC Managed Care – PPO | Attending: Emergency Medicine | Admitting: Emergency Medicine

## 2023-02-27 DIAGNOSIS — I10 Essential (primary) hypertension: Secondary | ICD-10-CM | POA: Diagnosis not present

## 2023-02-27 DIAGNOSIS — H538 Other visual disturbances: Secondary | ICD-10-CM | POA: Diagnosis not present

## 2023-02-27 DIAGNOSIS — R471 Dysarthria and anarthria: Secondary | ICD-10-CM | POA: Diagnosis present

## 2023-02-27 DIAGNOSIS — R519 Headache, unspecified: Secondary | ICD-10-CM | POA: Insufficient documentation

## 2023-02-27 DIAGNOSIS — I2510 Atherosclerotic heart disease of native coronary artery without angina pectoris: Secondary | ICD-10-CM | POA: Insufficient documentation

## 2023-02-27 DIAGNOSIS — R791 Abnormal coagulation profile: Secondary | ICD-10-CM | POA: Insufficient documentation

## 2023-02-27 LAB — CBC
HCT: 40.6 % (ref 36.0–46.0)
Hemoglobin: 13.5 g/dL (ref 12.0–15.0)
MCH: 29.8 pg (ref 26.0–34.0)
MCHC: 33.3 g/dL (ref 30.0–36.0)
MCV: 89.6 fL (ref 80.0–100.0)
Platelets: 244 10*3/uL (ref 150–400)
RBC: 4.53 MIL/uL (ref 3.87–5.11)
RDW: 12.6 % (ref 11.5–15.5)
WBC: 7.4 10*3/uL (ref 4.0–10.5)
nRBC: 0 % (ref 0.0–0.2)

## 2023-02-27 LAB — ETHANOL: Alcohol, Ethyl (B): <10 mg/dL (ref <10)

## 2023-02-27 LAB — COMPREHENSIVE METABOLIC PANEL
ALT: 22 U/L (ref 0–44)
AST: 21 U/L (ref 15–41)
Albumin: 3.9 g/dL (ref 3.5–5.0)
Alkaline Phosphatase: 105 U/L (ref 38–126)
Anion gap: 8 (ref 5–15)
BUN: 12 mg/dL (ref 8–23)
CO2: 29 mmol/L (ref 22–32)
Calcium: 9 mg/dL (ref 8.9–10.3)
Chloride: 102 mmol/L (ref 98–111)
Creatinine, Ser: 0.82 mg/dL (ref 0.44–1.00)
GFR, Estimated: >60 mL/min (ref >60)
Glucose, Bld: 115 mg/dL — ABNORMAL HIGH (ref 70–99)
Potassium: 3.6 mmol/L (ref 3.5–5.1)
Sodium: 139 mmol/L (ref 135–145)
Total Bilirubin: 0.5 mg/dL (ref 0.3–1.2)
Total Protein: 7.2 g/dL (ref 6.5–8.1)

## 2023-02-27 LAB — CBG MONITORING, ED: Glucose-Capillary: 98 mg/dL (ref 70–99)

## 2023-02-27 LAB — DIFFERENTIAL
Abs Immature Granulocytes: 0.02 10*3/uL (ref 0.00–0.07)
Basophils Absolute: 0 10*3/uL (ref 0.0–0.1)
Basophils Relative: 1 %
Eosinophils Absolute: 0.3 10*3/uL (ref 0.0–0.5)
Eosinophils Relative: 4 %
Immature Granulocytes: 0 %
Lymphocytes Relative: 30 %
Lymphs Abs: 2.2 10*3/uL (ref 0.7–4.0)
Monocytes Absolute: 0.6 10*3/uL (ref 0.1–1.0)
Monocytes Relative: 8 %
Neutro Abs: 4.3 10*3/uL (ref 1.7–7.7)
Neutrophils Relative %: 57 %

## 2023-02-27 LAB — APTT: aPTT: 26 s (ref 24–36)

## 2023-02-27 LAB — PROTIME-INR
INR: 1 (ref 0.8–1.2)
Prothrombin Time: 13 s (ref 11.4–15.2)

## 2023-02-27 NOTE — ED Triage Notes (Signed)
Pt here with blurred vision and slurred speech for the past couple weeks. Pt denies weakness in her extremities. Pt states she has a headache this morning and her daughters became concerned about her having a stroke. Pt states some nausea but denies vomiting or diarrhea.

## 2023-02-27 NOTE — ED Provider Notes (Addendum)
[facility] Provider Note    Event Date/Time   First MD Initiated Contact with Patient 02/27/23 [phone]     (approximate)   History   Aphasia   HPI  Denise Mcintyre is a 62 y.o. female with a history of CAD, hypertension, hyperlipidemia who presents with dysarthria for the last 3 weeks, persistent course, associated with right eye blurred vision and floaters for the last several days.  The patient also has a mild headache that started today.  She has no weakness or numbness in her arms or legs.  She has no dizziness.  She denies any fever or chills.  She has no vomiting or diarrhea.  She states that she came in today because her daughter got concerned and told her to come be evaluated.  She describes the vision disturbance as the area of blurry vision over the upper part of her visual field on the right, with a dark area that seems to be floating around.  There is no actual full vision loss to any part of her visual field.  I reviewed the past medical records.  The patient's most recent outpatient encounter was on 8/9 with Dr. Juliann Pares from cardiology for follow-up of her chronic conditions.   Physical Exam   Triage Vital Signs: ED Triage Vitals  Encounter Vitals Group     BP 02/27/23 0848 (!) 146/114     Systolic BP Percentile --      Diastolic BP Percentile --      Pulse Rate 02/27/23 0848 61     Resp 02/27/23 0848 17     Temp 02/27/23 0848 98 F (36.7 C)     Temp Source 02/27/23 0848 Oral     SpO2 02/27/23 0848 99 %     Weight 02/27/23 0849 220 lb 0.3 oz (99.8 kg)     Height 02/27/23 0849 5' 5.5" (1.664 m)     Head Circumference --      Peak Flow --      Pain Score 02/27/23 0849 4     Pain Loc --      Pain Education --      Exclude from Growth Chart --     Most recent vital signs: Vitals:   02/27/23 0848 02/27/23 0930  BP: (!) 146/114 (!) 116/49  Pulse: 61 (!) 55  Resp: 17 16  Temp: 98 F (36.7 C)   SpO2: 99% 97%     General: Alert and  oriented, no distress.  CV:  Good peripheral perfusion.  Resp:  Normal effort.  Abd:  No distention.  Other:  EOMI.  PERRLA.  No photophobia.  No facial droop.  Cranial nerves II through XII grossly intact.  Motor and sensory intact in all extremities.  No pronator drift.  No ataxia on finger-to-nose.  Dysarthric speech.   ED Results / Procedures / Treatments   Labs (all labs ordered are listed, but only abnormal results are displayed) Labs Reviewed  COMPREHENSIVE METABOLIC PANEL - Abnormal; Notable for the following components:      Result Value   Glucose, Bld 115 (*)    All other components within normal limits  PROTIME-INR  APTT  CBC  DIFFERENTIAL  ETHANOL  CBG MONITORING, ED     EKG  ED ECG REPORT I, Dionne Bucy, the attending physician, personally viewed and interpreted this ECG.  Date: 02/27/2023 EKG Time: 0854 Rate: 57 Rhythm: normal sinus rhythm QRS Axis: normal Intervals: normal ST/T Wave abnormalities: Nonspecific ST  abnormalities Narrative Interpretation: Nonspecific abnormalities with no evidence of acute ischemia; no recent prior EKG available for comparison    RADIOLOGY  CT head: I independently viewed and interpreted the images; there is no ICH.  Radiology report indicates no acute abnormalities.  MR brain:   IMPRESSION:  1. No acute intracranial abnormality.  2. Scattered cerebral white matter signal changes most compatible  with chronic small vessel disease, mild to moderate for age.  3. Persistent left side Trigeminal Artery, a rare normal vascular  variation.    PROCEDURES:  Critical Care performed: No  Procedures   MEDICATIONS ORDERED IN ED: Medications - No data to display   IMPRESSION / MDM / ASSESSMENT AND PLAN / ED COURSE  I reviewed the triage vital signs and the nursing notes.  62 year old female with PMH as noted above presents with dysarthric speech for the last 3 weeks associated with right eye blurred vision for  the last several days and a mild headache.  On exam vitals are normal.  Neurologic exam is otherwise nonfocal.  Differential diagnosis includes, but is not limited to, subacute CVA, complex migraine.  Given the lack of eye pain or actual vision loss there is no evidence of optic neuritis, retinal detachment, acute glaucoma, or CRAO.  We will obtain lab workup, CT head, and reassess.  Patient's presentation is most consistent with acute presentation with potential threat to life or bodily function.  The patient is on the cardiac monitor to evaluate for evidence of arrhythmia and/or significant heart rate changes.  ----------------------------------------- 12:13 PM on 02/27/2023 -----------------------------------------  CT was negative for acute findings.  I consulted Dr. Wilford Corner from neurology who recommended an MRI for further evaluation.  The MRI is also negative.  There is no evidence of stroke.  I performed a bedside ultrasound of the right eye.  There is no evidence of retinal detachment or vitreous hemorrhage.  Ocular ultrasound appears normal.  There is no indication for emergent ophthalmology consult.  On reassessment, the patient's headache has resolved on its own.  Dr. Wilford Corner advises that there is no indication for admission or further emergent workup at this time and would recommend outpatient ophthalmology and neurology follow-up.  The patient has her own ophthalmologist who she would like to follow-up with.  I have given neurology referral.  I counseled her on the results of the workup and the plan of care.  I gave strict return precautions and the patient expresses understanding.   FINAL CLINICAL IMPRESSION(S) / ED DIAGNOSES   Final diagnoses:  Dysarthria  Blurred vision     Rx / DC Orders   ED Discharge Orders     None        Note:  This document was prepared using Dragon voice recognition software and may include unintentional dictation errors.    Dionne Bucy, MD 02/27/23 1215    Dionne Bucy, MD 02/27/23 1610    Dionne Bucy, MD 02/27/23 1231

## 2023-02-27 NOTE — Discharge Instructions (Signed)
Your CT scan and MRI do not show any signs of a stroke.  You should follow-up with your eye doctor within the next several days to evaluate the blurred vision further.  We have also given you referral to follow-up with a neurologist.    Return to the ER immediately for new, worsening, or persistent severe blurred vision, eye pain, loss of vision, worsening changes in your speech, difficulty thinking of what you are trying to say, weakness or numbness in your arms or legs, facial droop, difficulty with coordination or walking, or any other new or worsening symptoms that concern you.

## 2023-08-12 ENCOUNTER — Ambulatory Visit: Payer: Self-pay

## 2023-08-12 DIAGNOSIS — Z09 Encounter for follow-up examination after completed treatment for conditions other than malignant neoplasm: Secondary | ICD-10-CM | POA: Diagnosis present

## 2023-08-12 DIAGNOSIS — Z860101 Personal history of adenomatous and serrated colon polyps: Secondary | ICD-10-CM | POA: Diagnosis not present

## 2023-08-12 DIAGNOSIS — K5730 Diverticulosis of large intestine without perforation or abscess without bleeding: Secondary | ICD-10-CM | POA: Diagnosis not present

## 2023-08-12 DIAGNOSIS — D123 Benign neoplasm of transverse colon: Secondary | ICD-10-CM | POA: Diagnosis not present

## 2023-08-31 ENCOUNTER — Other Ambulatory Visit: Payer: Self-pay | Admitting: Family Medicine

## 2023-08-31 DIAGNOSIS — Z1231 Encounter for screening mammogram for malignant neoplasm of breast: Secondary | ICD-10-CM

## 2023-08-31 DIAGNOSIS — Z780 Asymptomatic menopausal state: Secondary | ICD-10-CM

## 2023-10-25 ENCOUNTER — Ambulatory Visit
Admission: RE | Admit: 2023-10-25 | Discharge: 2023-10-25 | Disposition: A | Discharge status: Home / Self Care | Source: Ambulatory Visit | Attending: Family Medicine | Admitting: Family Medicine

## 2023-10-25 DIAGNOSIS — Z780 Asymptomatic menopausal state: Secondary | ICD-10-CM | POA: Diagnosis present

## 2023-10-25 DIAGNOSIS — Z1231 Encounter for screening mammogram for malignant neoplasm of breast: Secondary | ICD-10-CM | POA: Insufficient documentation

## 2024-01-05 ENCOUNTER — Other Ambulatory Visit: Payer: Self-pay | Admitting: Cardiology

## 2024-01-05 DIAGNOSIS — I2510 Atherosclerotic heart disease of native coronary artery without angina pectoris: Secondary | ICD-10-CM

## 2024-01-05 DIAGNOSIS — E119 Type 2 diabetes mellitus without complications: Secondary | ICD-10-CM

## 2024-01-05 DIAGNOSIS — I77810 Thoracic aortic ectasia: Secondary | ICD-10-CM

## 2024-01-05 DIAGNOSIS — R0789 Other chest pain: Secondary | ICD-10-CM

## 2024-01-05 DIAGNOSIS — E782 Mixed hyperlipidemia: Secondary | ICD-10-CM

## 2024-01-05 DIAGNOSIS — I10 Essential (primary) hypertension: Secondary | ICD-10-CM

## 2024-01-11 ENCOUNTER — Encounter (HOSPITAL_COMMUNITY): Payer: Self-pay

## 2024-01-16 ENCOUNTER — Other Ambulatory Visit: Payer: Self-pay | Admitting: Cardiology

## 2024-01-16 ENCOUNTER — Ambulatory Visit
Admission: RE | Admit: 2024-01-16 | Discharge: 2024-01-16 | Disposition: A | Discharge status: Home / Self Care | Source: Ambulatory Visit | Attending: Cardiology | Admitting: Cardiology

## 2024-01-16 DIAGNOSIS — I10 Essential (primary) hypertension: Secondary | ICD-10-CM | POA: Insufficient documentation

## 2024-01-16 DIAGNOSIS — E782 Mixed hyperlipidemia: Secondary | ICD-10-CM | POA: Insufficient documentation

## 2024-01-16 DIAGNOSIS — R931 Abnormal findings on diagnostic imaging of heart and coronary circulation: Secondary | ICD-10-CM | POA: Insufficient documentation

## 2024-01-16 DIAGNOSIS — I25118 Atherosclerotic heart disease of native coronary artery with other forms of angina pectoris: Secondary | ICD-10-CM

## 2024-01-16 DIAGNOSIS — I2510 Atherosclerotic heart disease of native coronary artery without angina pectoris: Secondary | ICD-10-CM | POA: Insufficient documentation

## 2024-01-16 DIAGNOSIS — E119 Type 2 diabetes mellitus without complications: Secondary | ICD-10-CM | POA: Insufficient documentation

## 2024-01-16 DIAGNOSIS — I2584 Coronary atherosclerosis due to calcified coronary lesion: Secondary | ICD-10-CM | POA: Diagnosis not present

## 2024-01-16 DIAGNOSIS — I77810 Thoracic aortic ectasia: Secondary | ICD-10-CM | POA: Diagnosis not present

## 2024-01-16 DIAGNOSIS — R0789 Other chest pain: Secondary | ICD-10-CM | POA: Diagnosis present

## 2024-01-16 MED ORDER — NITROGLYCERIN 0.4 MG SL SUBL
SUBLINGUAL_TABLET | SUBLINGUAL | Status: AC
  Filled 2024-01-16: qty 2

## 2024-01-16 MED ORDER — NITROGLYCERIN 0.4 MG SL SUBL
0.8000 mg | SUBLINGUAL_TABLET | Freq: Once | SUBLINGUAL | Status: AC
  Administered 2024-01-16: 0.8 mg via SUBLINGUAL
  Filled 2024-01-16: qty 25

## 2024-01-16 MED ORDER — IOHEXOL 350 MG/ML SOLN
100.0000 mL | Freq: Once | INTRAVENOUS | Status: AC | PRN
  Administered 2024-01-16: 100 mL via INTRAVENOUS

## 2024-02-23 NOTE — Progress Notes (Signed)
 Chief Complaint:   Chief Complaint  Patient presents with  . Headache    All starting around February 11, 2024   . Fever    Slight fever comes and goes per patient. Patient reports 99-100.3    Subjective  Denise Mcintyre is a 63 y.o. female in today for: History of Present Illness Denise Mcintyre is a 63 year old female who presents with sinus headaches and possible sinusitis.  She has been experiencing sinus headaches for the past two weeks, initially attributing them to her eyes issues. The headaches are located around the bridge of her nose and the top of her head, with a sensation of pressure. She feels warm every other day with a temperature not exceeding 100.33F. No congestion or rhinorrhea is present.In August, she experienced an illness resembling the flu, lasting three to four days, but recovered without lingering symptoms. She denies any recent cold preceding the current symptoms.  She has a history of sinus infection issues in the past, particularly during the fall, and previously managed symptoms with Allegra 180 mg daily (daily which helped prevent her from getting infections over the last few years). However, which she stopped taking in December or January. She has not resumed it since then. She also used Flonase nasal spray in the past but does not currently have any.  She is to have multiple regarding intermittent sinus infections, prior to the use of Allegra  Her medical history includes cataract surgery and dry eyes, which she manages with eye drops. She quit smoking and has not resumed.  Her current medications include Metformin, taken once daily, and she monitors her blood sugar levels, which she reports are stable. HPI   Patient Active Problem List  Diagnosis  . Hyperlipidemia  . Hypertension  . Hx of smoking  . Depression  . Knee osteoarthritis- left  . Diet-controlled diabetes mellitus (CMS-HCC)  . Hematuria, microscopic  . S/P total knee arthroplasty  .  Bilateral hearing loss  . Coronary artery disease involving native coronary artery of native heart without angina pectoris  . Coronary artery disease involving native coronary artery of native heart  . Aortic root aneurysm (HHS-HCC)  . Hypertensive left ventricular hypertrophy, without heart failure  . Centrilobular emphysema (CMS/HHS-HCC)    Outpatient Medications Prior to Visit  Medication Sig Dispense Refill  . amLODIPine  (NORVASC ) 10 MG tablet TAKE 1 TABLET(10 MG) BY MOUTH DAILY 90 tablet 3  . aspirin  81 MG EC tablet Take 81 mg by mouth once daily.    . buPROPion  (WELLBUTRIN  SR) 150 MG SR tablet Take 1 tablet (150 mg total) by mouth 2 (two) times daily 180 tablet 3  . CINNAMON  BARK ORAL Take 1 capsule by mouth once daily    . evolocumab (REPATHA SURECLICK) 140 mg/mL PnIj Inject 140 mg subcutaneously every 14 (fourteen) days 6 mL 3  . hydroCHLOROthiazide  (HYDRODIURIL ) 25 MG tablet Take 1 tablet (25 mg total) by mouth once daily 90 tablet 3  . latanoprost (XALATAN) 0.005 % ophthalmic solution INSTILL 1 DROP IN BOTH EYES EVERY NIGHT AT BEDTIME    . metFORMIN (GLUCOPHAGE-XR) 500 MG XR tablet Take 1 tablet (500 mg total) by mouth daily with dinner 90 tablet 3  . omega-3 fatty acids-fish oil 360-1,200 mg Cap Take 1,200 mg by mouth once daily.    SABRA telmisartan (MICARDIS) 80 MG tablet Take 1 tablet (80 mg total) by mouth once daily 90 tablet 3  . timoloL maleate (TIMOPTIC) 0.5 % ophthalmic solution INSTILL 1 DROP  INTO LEFT EYE EVERY MORNING    . clindamycin  (CLEOCIN ) 150 MG capsule Take by mouth. Take 4 capsules by mouth 1 hour prior to dental appointment  0  . MELATONIN ORAL Take by mouth     No facility-administered medications prior to visit.     Objective  Vitals:   02/23/24 1302  BP: 133/73  Pulse: 61  Temp: 36.7 C (98.1 F)  Weight: 88.2 kg (194 lb 6.4 oz)  PainSc:   5  PainLoc: Head - Frontal   Body mass index is 31.38 kg/m. Home Vitals:     Physical Exam HEENT: Right  tympanic membrane clear with good light reflex, not erythematous. Left tympanic membrane clear with good light reflex, not erythematous. Tonsils not erythematous. Sinus tenderness present.  Oropharynx was clear, nasal passages clear, tenderness palpation of bilateral frontal sinuses. Physical Exam Constitutional: alert, in NAD, and communicates well Eye exam: pupils equal and reactive, extraocular eye movements intact. Neck: supple, no thyroid enlargement or cervical adenopathy, and no bruits heard Respiratory: clear to auscultation, without rales or wheezes  Cardiovascular: regular rate and rhythm and without murmurs, rubs or gallops Lower extremities: no lower extremity edema   Results   No results found for this visit on 02/23/24.     Assessment/Plan:   Assessment & Plan Allergic rhinitis with acute allergic sinusitis   She experiences sinus headaches, mild fever, and nasal pressure, worsened by seasonal allergies in the fall. Previously managed with Allegra and Flonase, she currently has no nasal congestion or runny nose The primary suspicion is allergy-related sinusitis, with a mild sinus infection as a differential. Antibiotics are reserved for non-improvement after allergy treatment.. Prescribe Allegra 180 mg daily and Flonase nasal spray. Advise using allergy medications for one week and monitor symptoms. Provide a prescription for antibiotics if no improvement occurs after one week.  Type 2 diabetes mellitus without complications   Her blood glucose levels are well-controlled with metformin. She is attempting dietary control but finds it challenging. Continue metformin as prescribed. Encourage dietary management and regular blood glucose monitoring.  She reports she is not checking her fingerstick blood glucoses.  Therefore, we do not know if this sinus issue is impacting her sugar health.  Diagnoses and all orders for this visit:  Sinus congestion  Allergic sinusitis  Other  orders -     fexofenadine (ALLEGRA) 180 MG tablet; Take 1 tablet (180 mg total) by mouth once daily -     fluticasone propionate (FLONASE) 50 mcg/actuation nasal spray; Place 2 sprays into both nostrils once daily for 30 days -     azithromycin (ZITHROMAX) 250 MG tablet; Take 2 tablets (500mg ) by mouth on Day 1. Take 1 tablet (250mg ) by mouth on Days 2-5.    This visit was coded based on medical decision making (MDM).           Future Appointments     Date/Time Provider Department Center Visit Type   04/10/2024 8:30 AM (Arrive by 8:00 AM) [facility] SHORT   04/10/2024 11:30 AM (Arrive by 11:00 AM) Kennth, [place] Lee, MD [facility] RETURN VISIT   06/04/2024 2:00 PM (Arrive by 1:45 PM) Roselie Audra BIRCH, RD [facility] [place] [facility] EDUCATION   06/25/2024 11:00 AM (Arrive by 10:45 AM) Jyl Heron Haff, MD [facility] [place] [facility] PHYSICAL   08/03/2024 10:30 AM Alluri, Keller Grist, MD [facility] FOLLOW UP  There are no Patient Instructions on file for this visit.  An after visit summary was provided for the patient either in written format (printed) or through [facility].  This note has been created using automated tools and reviewed for accuracy by BARBARA D ALDRIDGE.

## 2024-05-30 NOTE — Progress Notes (Signed)
 "  Motor Speech Evaluation   Location: Outpatient/[facility] Time of Service: [phone] Verification from Source Document: Patient's name, Patient's DOB, and Correct procedure Has patient experienced any unanticipated or non-developmental fall in the past 90 days? no  Is patient a Falls Risk? no Is patient wearing yellow band? no Does patient need a wheelchair/ assistance? no   Medical History  Current Medical History: Denise Mcintyre is a 63 y.o. female with speech changes of an unknown etiology.    Relevant Imaging and/or procedures: N/a  Relevant Past Medical History:  Past Medical History:  Diagnosis Date   Deafness in right ear    Depression    Hyperlipidemia    Hypertension    Tobacco abuse     Social History  History provided by: patient  Social History: Patient lives alone. 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 Baseline Responsibilities:  Independent with all ADLs/iADLs Education: [employer]    Communication History  Communication History: Patient reported slurred speech that began ~1 year prior. She stated she did not notice a change in her speech and only became aware after her children made comments. Onset of speech changes was accompanied by a headache and visual changes, which lead her to believe she was having a stroke, however this was ruled out upon visit to the emergency department. She denied any difficulty with communication as a result of speech changes.  Concerns present for: Changes in articulatory precision Current communication difficulties (date of onset): September 2024 Prior speech pathology services for communication: Patient with no known prior SLP services.   Evaluation Environment  Level of Alertness: awake, alert, participative Pain Score: No pain / none reported Communication Status: participated in conversational speech without difficulty Hearing: Patient  reported she is deaf in her right ear and wears hearing aids only when she is at work.  Vision:  Unknown; no reported concerns  Interpreter services: N/A, no interpreter needed Accompanied by:  unaccompanied Patient/Family Priorities for this session: Address speech concerns    Objective   Respiratory Status: Room air  Oral Motor Examination:   Face: No noted abnormalities Jaw: No noted abnormalities Lips: No noted abnormalities  Tongue: No noted abnormalities Palate: No noted abnormalities   Oral Health: Dentition: Natural Secretion management: Managing secretions  Outcome Measures: Communicative Participation Item Bank (CPIB): This survey is a scale in which the patient rates the effectiveness of his/her speech using a 0-3 scale (0=very much, 3=not at all). The pts total score was 29/30, indicating a mild level of perceived impairment.  Does your condition interfere with Rating  Talking with people you know? 3  Communicating when you need to say something quickly? 3  Talking with people you do NOT know? 3  Communicating when you are out in your community (e.g., errands, appointments)? 3  Asking questions in a conversation? 3  Communicating in a small group of people? 3  Having a long conversation with someone you know about a book, movie, show, or sports event? 3  Giving someone DETAILED information? 3  Getting your turn in a fast moving conversation? 2  Trying to persuade a friend or family member to see a different point of view? 3   TOTAL: (lower score indicates more perceived impairment)   29/30   Maximum Performance Tasks:  Alternating motion rates (AMR)- /p?/, /t?/, and /k?/: Imprecise , increased imprecision on /k/ as compared to /p/ and /t/ Sequential motion rates (  SMR)- /p?t?k?/ or buttercup: Imprecise   Rising glissando: Normal  Maximum phonation duration: 10.38 seconds  Minimum values duration   Age  Female Female  < 65 22.6 seconds 15.2 seconds  >65  13 seconds 10 seconds    Perceptual Assessment of Speech Tasks:  The following tasks were completed for assessment of speech: Vowel prolongation Maximal loudness task  Resonant speech tasks  Repetitions of single syllabic words, multi-syllabic words and short sentences  Reading passage Conversational speech   Respiration: Normal  Phonation: Hoarse Resonance/Intra-Oral Pressure: Normal Articulation: Imprecise consonants  Prosody: Normal Pitch: Normal Loudness:  - Habitual: Normal  - Maximum: Normal  - Range: Normal  - Reading: Normal  Overall Intelligibility: Patient was  100% intelligible in a quiet room with an attentive listener.  Other observations: None  Motor speech disorders severity rating scale  Based on motor speech aspects, not language or cognitive impairments:  8- Perceived Speech Changes: Speech is understandable but changes are evident to others, especially under adverse conditions (e.g. fatigue, stress, in noisy environments)  (Per Duffy 2020's modification of Hillel AD et al. ALS Severity Scale. J Neuroepidemiol.1989; 8:142-150)   Therapeutic Interventions     Strategies Trialed Efficacy Comments   Slow Rate  Effective Articulatory precision improved when patient was cued to speak at a slow rate and over articulate.  Overarticulation  Effective      Assessment   ICD-10: (R47.1) Dysarthria Severity: Mild impairment  Patient presents with speech changes of an unknown etiology, that first occurred ~1 year prior and have stay consistent since that time. Speech is characterized by imprecise articulation of consonants, resulting in a slurred quality. Articulatory precision improves when speaking slowly or in single word responses and worsens in conversational speech. Despite deficits, speech is 100% intelligible to an attentive listener in a quiet environment. Patient denied any difficulty communicating, however reported receiving frequent comments regarding her  speech from family and friends. Introduced dysarthria treatment and discussed/trialed clear speech strategies, which improved speech clarity. Patient indicated interest in treatment. Based on results of today's evaluation, further skilled speech pathology treatment is warranted. See plan below.   Prognosis:  Good for improved function with skilled Speech Pathology services focusing on stated goals. Prognostic indicators include: understanding of information presented  Education   Patient Education was provided via verbal communication re: role of SLP.  Patient verbally expressed/demonstrated understanding.  Barriers to patient education include: none.  Will continue to provide education in subsequent sessions as warranted.    Plan / Recommendations   Recommend referral to: No recommended referrals or requested orders at this time  Skilled Speech Pathology services: 1x/week  for 1 months. Patient will need to call [phone] to schedule treatment sessions.   Cognitive Motor Goals:  Long Term Goal: Patient will improve perception of speech quality as evidenced by patient and family report within 4 weeks of initial treatment session.  Short Term Goal: GOAL: To increase awareness and understanding of compensatory strategies, the patient will name and complete an example of at least 3 strategies (over-articulation, slow rate of speech, talking loud, taking a deep replenishing breath) to improve speech intelligibility independently within 4 weeks of initial therapy session.  GOAL: Patient will successfully implement compensatory strategies beginning at the phrase level and building to the conversational level, with min clinician cues, within 4 weeks of initial therapy session.   GOAL: To maximize use of speech strategies and increase meta-cognitive awareness of speech intelligibility, the patient will rate themselves a 3  on a scale of 1-3 indicating their best possible speech throughout 90% of  therapy session within 4 weeks of initial therapy session.   The patient was in agreement with the stated recommendations.   Thank you for this referral. Please call [phone] with questions.  LEITA DELENA HURST, CCC-SLP *Referring Provider: Mayberry, Jordan Lee, MD [address] [place],  [place] [address]    "
